# Patient Record
Sex: Male | Born: 1985 | Hispanic: No | Marital: Married | Smoking: Never smoker
Health system: Southern US, Community
[De-identification: ages and names within clinical notes are randomized; demographics above are authoritative.]

## PROBLEM LIST (undated history)

## (undated) DIAGNOSIS — E079 Disorder of thyroid, unspecified: Secondary | ICD-10-CM

## (undated) DIAGNOSIS — E7800 Pure hypercholesterolemia, unspecified: Secondary | ICD-10-CM

## (undated) DIAGNOSIS — R42 Dizziness and giddiness: Secondary | ICD-10-CM

## (undated) DIAGNOSIS — M93262 Osteochondritis dissecans, left knee: Secondary | ICD-10-CM

## (undated) DIAGNOSIS — Z973 Presence of spectacles and contact lenses: Secondary | ICD-10-CM

## (undated) HISTORY — PX: APPENDECTOMY: SHX54

---

## 2014-12-10 ENCOUNTER — Encounter (HOSPITAL_COMMUNITY): Payer: Self-pay | Admitting: Adult Health

## 2014-12-10 ENCOUNTER — Emergency Department (HOSPITAL_COMMUNITY)
Admission: EM | Admit: 2014-12-10 | Discharge: 2014-12-10 | Disposition: A | Discharge status: Home / Self Care | Payer: PPO | Attending: Emergency Medicine | Admitting: Emergency Medicine

## 2014-12-10 DIAGNOSIS — H81399 Other peripheral vertigo, unspecified ear: Secondary | ICD-10-CM | POA: Insufficient documentation

## 2014-12-10 DIAGNOSIS — R42 Dizziness and giddiness: Secondary | ICD-10-CM | POA: Diagnosis present

## 2014-12-10 DIAGNOSIS — H5500 Unspecified nystagmus: Secondary | ICD-10-CM | POA: Diagnosis not present

## 2014-12-10 LAB — I-STAT CHEM 8, ED
BUN: 13 mg/dL (ref 6–23)
CALCIUM ION: 1.21 mmol/L (ref 1.12–1.23)
CHLORIDE: 105 mmol/L (ref 96–112)
Creatinine, Ser: 0.8 mg/dL (ref 0.50–1.35)
GLUCOSE: 122 mg/dL — AB (ref 70–99)
HCT: 45 % (ref 39.0–52.0)
HEMOGLOBIN: 15.3 g/dL (ref 13.0–17.0)
Potassium: 3.7 mmol/L (ref 3.5–5.1)
Sodium: 142 mmol/L (ref 135–145)
TCO2: 20 mmol/L (ref 0–100)

## 2014-12-10 LAB — CBG MONITORING, ED: Glucose-Capillary: 112 mg/dL — ABNORMAL HIGH (ref 70–99)

## 2014-12-10 MED ORDER — MECLIZINE HCL 50 MG PO TABS: 50.0000 mg | ORAL_TABLET | Freq: Two times a day (BID) | ORAL | Status: DC | PRN

## 2014-12-10 MED ORDER — MECLIZINE HCL 25 MG PO TABS
25.0000 mg | ORAL_TABLET | Freq: Once | ORAL | Status: AC
  Administered 2014-12-10: 25 mg via ORAL
  Filled 2014-12-10: qty 1

## 2014-12-10 NOTE — ED Provider Notes (Signed)
CSN: [id number]     Arrival date & time 12/10/14  1904 History   First MD Initiated Contact with Patient 12/10/14 2056     Chief Complaint  Patient presents with  . Dizziness    (Consider location/radiation/quality/duration/timing/severity/associated sxs/prior Treatment) HPI Comments: Patient is a 29 year old male with no significant past medical history who presents to the emergency department for further evaluation of dizziness 3 days. Patient states that he has the sensation of being off balance. His symptoms are intermittent, worse when reading or upon waking in the morning. He states that his symptoms improve when he extends his neck and looks up towards. He denies associated fever, headache, nausea, vomiting, ear pain or discharge, and extremity numbness/weakness. No reported head injury or trauma. Patient has had similar symptoms one year ago for which she was given a medication that he cannot name. His symptoms lasted for approximately one week before resolving. He also had a recurrence of symptoms 1-2 months ago which improved after his bilateral ears were cleaned. He reports seeing an ENT for this.  Patient is a 29 y.o. male presenting with dizziness. The history is provided by the patient. No language interpreter was used.  Dizziness Quality:  Imbalance Severity:  Mild Onset quality:  Gradual Duration:  3 days Timing:  Intermittent Progression:  Waxing and waning Chronicity:  Recurrent Relieved by: Extending neck/looking up. Associated symptoms: no diarrhea, no headaches, no hearing loss, no nausea, no shortness of breath, no syncope, no vomiting and no weakness   Risk factors: hx of vertigo   Risk factors: no new medications     History reviewed. No pertinent past medical history. History reviewed. No pertinent past surgical history. History reviewed. No pertinent family history. History  Substance Use Topics  . Smoking status: Never Smoker   . Smokeless tobacco: Not on  file  . Alcohol Use: No    Review of Systems  Constitutional: Negative for fever.  HENT: Negative for hearing loss.   Respiratory: Negative for shortness of breath.   Cardiovascular: Negative for syncope.  Gastrointestinal: Negative for nausea, vomiting and diarrhea.  Neurological: Positive for dizziness. Negative for syncope, weakness, numbness and headaches.  All other systems reviewed and are negative.   Allergies  Review of patient's allergies indicates no known allergies.  Home Medications   Prior to Admission medications   Medication Sig Start Date End Date Taking? Authorizing Provider  meclizine (ANTIVERT) 50 MG tablet Take 1 tablet (50 mg total) by mouth 2 (two) times daily as needed for dizziness. 12/10/14   Antony MaduraKelly Tanganyika Bowlds, PA-C   BP 120/73 mmHg  Pulse 70  Temp(Src) 98 F (36.7 C) (Oral)  Resp 18  Ht 5\' 10"  (1.778 m)  Wt 190 lb (86.183 kg)  BMI 27.26 kg/m2  SpO2 100%   Physical Exam  Constitutional: He is oriented to person, place, and time. He appears well-developed and well-nourished. No distress.  Nontoxic/nonseptic appearing  HENT:  Head: Normocephalic and atraumatic.  Mouth/Throat: Oropharynx is clear and moist. No oropharyngeal exudate.  Uvula midline. Symmetric rise of the uvula with phonation.  Eyes: Conjunctivae are normal. Pupils are equal, round, and reactive to light. No scleral icterus. Right eye exhibits nystagmus. Left eye exhibits nystagmus.  PERRL. Horizontal nystagmus to the right in both eyes noted on testing of EOMs. Snellen 20/20 OU, 20/25 OS and OD  Neck: Normal range of motion.  No nuchal rigidity or meningsimus  Cardiovascular: Normal rate, regular rhythm and intact distal pulses.   Pulmonary/Chest: Effort  normal. No respiratory distress.  Respirations even and unlabored  Musculoskeletal: Normal range of motion.  Neurological: He is alert and oriented to person, place, and time. He exhibits normal muscle tone. Coordination normal.  GCS 15.  Speech is goal oriented. Patient moves extremities without ataxia. Sensation to light touch equal bilaterally. Patient has normal grip strength. Patellar and Achilles reflexes 2+ bilaterally. 5/5 strength against resistance in all major muscle groups. No ataxia or pronator drift.  Skin: Skin is warm and dry. No rash noted. He is not diaphoretic. No erythema. No pallor.  Psychiatric: He has a normal mood and affect. His behavior is normal.  Nursing note and vitals reviewed.   ED Course  Procedures (including critical care time) Labs Review Labs Reviewed  CBG MONITORING, ED - Abnormal; Notable for the following:    Glucose-Capillary 112 (*)    All other components within normal limits  I-STAT CHEM 8, ED - Abnormal; Notable for the following:    Glucose, Bld 122 (*)    All other components within normal limits    Imaging Review No results found.   EKG Interpretation None      MDM   Final diagnoses:  Vertigo, peripheral, unspecified laterality    29 year old male presents to the emergency department for further evaluation of dizziness characterized by a sense of imbalance. He reports a history of similar symptoms over the past year. Patient's neurologic exam is significant only for a right-sided horizontal nystagmus with EOMs testing. No exam findings to suggest central cause of patient's vertigo. Do not believe further emergent work up or imaging is indicated. Will manage as outpatient with Meclizine. Have advised PCP f/u and provided return precautions. Patient agreeable to plan with no unaddressed concerns. Patient discharged from the ED in good condition.   Filed Vitals:   12/10/14 2145 12/10/14 2200 12/10/14 2201 12/10/14 2222  BP: 126/78 131/92 131/92 120/73  Pulse: 77 66 94 70  Temp:      TempSrc:      Resp:   16 18  Height:      Weight:      SpO2: 97% 96% 98% 100%       Antony Madura, PA-C 12/10/14 2349  Dione Booze, MD 12/10/14 2352

## 2014-12-10 NOTE — ED Notes (Signed)
Presents with dizziness and feeling off balance that began three days ago. HE had a similair episode last year and went to a doctor and was given a medication for dizziness that helped him, he is unsure of the name of the medication. He also saw an ent and had his ears cleaned out and that helped. This episode is different, not as severe, but does not go away. Denies nausea, denies pain, denies headaches. Alert, oriented and MAEx4. Putting head up makes dizziness better. Nothing makes dizziness worse.

## 2014-12-10 NOTE — ED Notes (Signed)
Pt stable, ambulatory, denies any pain, dizziness improved, states understanding of discharge iinstructions

## 2014-12-10 NOTE — Discharge Instructions (Signed)
Recommend follow-up with a primary care provider. Refer to the resource guide below if you do not have a primary care doctor. Take Antivert as prescribed for persistent vertigo symptoms. You may also try using the half somersault maneuver (attached handout) for symptoms. Return to the ED as needed if symptoms worsen.  Benign Positional Vertigo Vertigo means you feel like you or your surroundings are moving when they are not. Benign positional vertigo is the most common form of vertigo. Benign means that the cause of your condition is not serious. Benign positional vertigo is more common in older adults. CAUSES  Benign positional vertigo is the result of an upset in the labyrinth system. This is an area in the middle ear that helps control your balance. This may be caused by a viral infection, head injury, or repetitive motion. However, often no specific cause is found. SYMPTOMS  Symptoms of benign positional vertigo occur when you move your head or eyes in different directions. Some of the symptoms may include:  Loss of balance and falls.  Vomiting.  Blurred vision.  Dizziness.  Nausea.  Involuntary eye movements (nystagmus). DIAGNOSIS  Benign positional vertigo is usually diagnosed by physical exam. If the specific cause of your benign positional vertigo is unknown, your caregiver may perform imaging tests, such as magnetic resonance imaging (MRI) or computed tomography (CT). TREATMENT  Your caregiver may recommend movements or procedures to correct the benign positional vertigo. Medicines such as meclizine, benzodiazepines, and medicines for nausea may be used to treat your symptoms. In rare cases, if your symptoms are caused by certain conditions that affect the inner ear, you may need surgery. HOME CARE INSTRUCTIONS   Follow your caregiver's instructions.  Move slowly. Do not make sudden body or head movements.  Avoid driving.  Avoid operating heavy machinery.  Avoid performing  any tasks that would be dangerous to you or others during a vertigo episode.  Drink enough fluids to keep your urine clear or pale yellow. SEEK IMMEDIATE MEDICAL CARE IF:   You develop problems with walking, weakness, numbness, or using your arms, hands, or legs.  You have difficulty speaking.  You develop severe headaches.  Your nausea or vomiting continues or gets worse.  You develop visual changes.  Your family or friends notice any behavioral changes.  Your condition gets worse.  You have a fever.  You develop a stiff neck or sensitivity to light. MAKE SURE YOU:   Understand these instructions.  Will watch your condition.  Will get help right away if you are not doing well or get worse. Document Released: 05/12/2006 Document Revised: 10/27/2011 Document Reviewed: 04/24/2011 [facility] Patient Information 2015 [employer], [place]. This information is not intended to replace advice given to you by your health care provider. Make sure you discuss any questions you have with your health care provider.   Emergency Department Resource Guide 1) Find a Doctor and Pay Out of Pocket Although you won't have to find out who is covered by your insurance plan, it is a good idea to ask around and get recommendations. You will then need to call the office and see if the doctor you have chosen will accept you as a new patient and what types of options they offer for patients who are self-pay. Some doctors offer discounts or will set up payment plans for their patients who do not have insurance, but you will need to ask so you aren't surprised when you get to your appointment.  2) Contact Your Local  Health Department Not all health departments have doctors that can see patients for sick visits, but many do, so it is worth a call to see if yours does. If you don't know where your local health department is, you can check in your phone book. The CDC also has a tool to help you locate your state's  health department, and many state websites also have listings of all of their local health departments.  3) Find a Walk-in Clinic If your illness is not likely to be very severe or complicated, you may want to try a walk in clinic. These are popping up all over the country in pharmacies, drugstores, and shopping centers. They're usually staffed by nurse practitioners or physician assistants that have been trained to treat common illnesses and complaints. They're usually fairly quick and inexpensive. However, if you have serious medical issues or chronic medical problems, these are probably not your best option.  No Primary Care Doctor: - Call Health Connect at  [phone] - they can help you locate a primary care doctor that  accepts your insurance, provides certain services, etc. - Physician Referral Service- [phone]  Chronic Pain Problems: Organization         Address  Phone   Notes  [facility]  [phone] Patients need to be referred by their primary care doctor.   Medication Assistance: Organization         Address  Phone   Notes  [facility] [address] [place], [place] [address] [phone] --Must be a resident of [facility] -- Must have NO insurance coverage whatsoever (no Medicaid/ Medicare, etc.) -- The pt. MUST have a primary care doctor that directs their care regularly and follows them in the community   [facility]ist  [phone]   [employer]  [phone]    Agencies that provide inexpensive medical care: Organization         Address  Phone   Notes  [facility]  [phone]   [facility]    [phone]   [facility] [address] [place], [place] [address] [phone]   [facility] [address] [place] [place] [phone]   Planned Parenthood    [phone]   [facility]    [phone]    [facility]  [address], [place] Phone:  [phone], Fax:  [phone] Hours of Operation:  9 am - 6 pm, M-F.  Also accepts Medicaid/Medicare and self-pay.  [facility]  [address], [place] Phone: [phone], Fax: [phone]. Hours of Operation:  8:30 am - 5:30 pm, M-F.  Also accepts Medicaid and self-pay.  [facility] [place] [address] [place] Phone: [phone]   [facility] [address] Natasha Bence [place], [place] [phone], Ext. [phone] Mondays & Thursdays: 7-9 AM.  First 15 patients are seen on a first come, first serve basis.    Medicaid-accepting [facility] Providers:  Organization         Address  Phone   Notes  [facility] [address],  [phone] Also accepts self-pay patients.  [facility] [address] Laurell Josephs [place], [place]  [phone]   [facility] [address], [place] [phone]  [facility] [address] [place] [address], [place] [phone]) [phone]   Renaye RakersVeita Bland [address], [place]   [phone]) [phone] Only accepts [place] Access [place] patients after they have their name applied to their card.   Self-Pay (no insurance) in [facility]:  Organization         Address  Phone   Notes  Sickle Cell Patients, [facility] [address], [place] [phone]) [phone]   [facility] [address], [place] [phone]) [phone]   [facility] [place]  [address],  [phone]) [phone]   [facility]/Dr. Osei-Bonsu  [address] [place] [address], [place] or [phone], [place] [phone]) [phone] Phone number for both [place] and [place] locations is the same.  [facility] [address], [place] [phone](336) [phone]     [facility] [place] [address] [place] [address], [place] or [address] [phone]) [phone]   [facility] [address], [place] [phone](336) [phone], phone; [phone](336) [phone], fax Sees patients 1st and 3rd Saturday of every month.  Must not qualify for public or private insurance (i.e. Medicaid, Medicare, [place] Health Choice, Veterans' Benefits)  Household income should be no more than 200% of the poverty level The clinic cannot treat you if you are pregnant or think you are pregnant  Sexually transmitted diseases are not treated at the clinic.    Dental Care: Organization         Address  Phone  Notes  [facility] [address], [place] [phone] Accepts children up to age 30 who are enrolled in [place] or [place] Health Choice; pregnant women with a Medicaid card; and children who have applied for Medicaid or [place] Health Choice, but were declined, whose parents can pay a reduced fee at time of service.  [facility] [place]  [address], [place] [phone]) [phone] Accepts children up to age 30 who are enrolled in [place] or [place] Health Choice; pregnant women with a Medicaid card; and children who have applied for Medicaid or [place] Health Choice, but were declined, whose parents can pay a reduced fee at time of service.  [facility]  [address], [place] [phone]) [phone] Patients are seen by appointment only. Walk-ins are not accepted. [facility] will see patients 30 years of age and older. Monday - Tuesday (8am-5pm) Most Wednesdays (8:30-5pm) $30 per visit, cash only  [facility]  [address], [facility] [place] [phone] Patients are seen by appointment only. Walk-ins are not accepted. [facility] will see patients 30 years of age and older. One Wednesday Evening (Monthly: Volunteer Based).   $30 per visit, cash only  [employer] [facility]  [phone]) [phone] for adults; Children under age 794, call Graduate Pediatric Dentistry at [phone]. Children aged 54-14, please call [phone]) [phone] to request a pediatric application.  Dental services are provided in all areas of dental care including fillings, crowns and bridges, complete and partial dentures, implants, gum treatment, root canals, and extractions. Preventive care is also provided. Treatment is provided to both adults and children. Patients are selected via a lottery and there is often a waiting list.   [facility] [address], [place]  [phone]) [phone] [email or web address]  [facility] [address] [place], [place] [phone], Ext. [phone] Second and Fourth Thursday of each month, opens at 6:30 AM; Clinic ends at 9 AM.  Patients are seen on a first-come first-served basis, and a limited number are seen during each clinic.   [facility]  [address] Ether Griffins [place], [place] [phone]   Eligibility Requirements You must have lived in [place], [place], or [place] counties for at least the last three months.   You cannot be eligible for state or federal sponsored [place], including [employer], [place], or [employer].   You generally cannot be eligible for healthcare insurance through your employer.    How to apply: Eligibility screenings are held every Tuesday and Wednesday afternoon from 1:00 pm until 4:00 pm. You do not need an appointment for the interview!  [facility] [address] [place], [place] [phone]   [facility] Health Department  [phone]   [facility] Health Department  [phone]   [facility] Health Department  [phone]    Behavioral Health Resources in the Community: Intensive Outpatient Programs Organization         Address  Phone  Notes  [facility] Services [address] [place], [place] [phone]   [facility] [address], [place], [place] [phone]   [facility] [address] [place], [place]  [phone]   [facility] Health [address],  [place], [place] [phone] or [phone]   Substance Abuse Resources Organization         Address  Phone  Notes  [facility]  [phone]   [facility]  [phone]   [facility] [place]  [phone]   Floydene Flock  [phone]   Residential & Outpatient Substance Abuse Program  [phone]   Psychological Services Organization         Address  Phone  Notes  [facility]  [phone]   [facility]  [phone]   [facility] Mental Health [address], [place] [phone] or [phone]    Mobile Crisis Teams Organization         Address  Phone  Notes  [facility], Mobile Crisis Care Unit  [phone]   [facility]  [address] [place], [place] [phone]   Doristine Locks [address] [place] [phone]    Self-Help/Support Groups Organization         Address  Phone             Notes  Mental Health Assoc. of  - variety of support groups  [phone]- I7437963 Call for more information  Narcotics Anonymous (NA), [facility] [address], [employer] [place]  2 meetings at this location   Statistician         Address  Phone  Notes  [facility] [address],    [place]  [phone]   [facility]  [address], [place] [address], [place], [place] [phone]   [facility] [address] [place], [place] [phone] Admissions: 8am-3pm M-F  [facility] [address]    [place], [place] [phone]   [facility] [address] Starling Manns [place], [place] [phone]   [facility] [address],  [place], [place] [phone]   [facility] - Intensive Outpatient [phone], [place], [place]   [facility] ([place]  [place], [place] [phone] or [phone]   [facility] ([facility]) [address] [place], [place] Accepts Medicaid  [facility] [address],  [place] [phone] Substance Abuse/Addiction Treatment   [facility] Organization         Address  Phone  Notes  [facility]  [phone]   Domenic Schwab, PhD [address] Arlis [place], [place]   [phone] or [phone]   [place], [place] [phone]   [address], [place], [place] [phone] Insurance/Medicaid/sponsorship through [facility] and Families [address] [place]                                    [place], [place] [phone] [place] [address], [place] [phone]    Dr. Adele Schilder  [phone]   [facility] Dept. 1) [address] [place] 2) Pablo Pena 3)  [address], [place] [phone]  [phone]   [place] [phone] or [phone] (After Hours)

## 2015-02-21 ENCOUNTER — Other Ambulatory Visit: Payer: Self-pay | Admitting: Orthopedic Surgery

## 2015-03-02 ENCOUNTER — Encounter (HOSPITAL_BASED_OUTPATIENT_CLINIC_OR_DEPARTMENT_OTHER): Payer: Self-pay | Admitting: *Deleted

## 2015-03-02 NOTE — Progress Notes (Signed)
NPO AFTER MN WITH EXCEPTION CLEAR LIQUIDS UNTIL 0900.  ARRIVE AT 1315.  NEEDS HG.  PT STATED SPEAKS ENGLISH OKAY BUT SOME THINGS MEDICAL UNSURE, REQUESTED FOR ARABIC INTERPRETOR.  SEND EMAIL REQUESTING FOR INTERPRETOR.  WILL PUT CONFIRMATION IN CHART WHEN I GET IT.

## 2015-03-06 ENCOUNTER — Ambulatory Visit (HOSPITAL_BASED_OUTPATIENT_CLINIC_OR_DEPARTMENT_OTHER): Payer: PPO | Admitting: Anesthesiology

## 2015-03-06 ENCOUNTER — Encounter (HOSPITAL_BASED_OUTPATIENT_CLINIC_OR_DEPARTMENT_OTHER)
Admission: RE | Disposition: A | Discharge status: Home / Self Care | Payer: Self-pay | Source: Ambulatory Visit | Attending: Specialist

## 2015-03-06 ENCOUNTER — Ambulatory Visit (HOSPITAL_BASED_OUTPATIENT_CLINIC_OR_DEPARTMENT_OTHER)
Admission: RE | Admit: 2015-03-06 | Discharge: 2015-03-06 | Disposition: A | Discharge status: Home / Self Care | Payer: PPO | Source: Ambulatory Visit | Attending: Specialist | Admitting: Specialist

## 2015-03-06 ENCOUNTER — Encounter (HOSPITAL_BASED_OUTPATIENT_CLINIC_OR_DEPARTMENT_OTHER): Payer: Self-pay | Admitting: *Deleted

## 2015-03-06 DIAGNOSIS — Y999 Unspecified external cause status: Secondary | ICD-10-CM | POA: Diagnosis not present

## 2015-03-06 DIAGNOSIS — Y939 Activity, unspecified: Secondary | ICD-10-CM | POA: Insufficient documentation

## 2015-03-06 DIAGNOSIS — S83282A Other tear of lateral meniscus, current injury, left knee, initial encounter: Secondary | ICD-10-CM | POA: Diagnosis not present

## 2015-03-06 DIAGNOSIS — Y929 Unspecified place or not applicable: Secondary | ICD-10-CM | POA: Insufficient documentation

## 2015-03-06 DIAGNOSIS — M93262 Osteochondritis dissecans, left knee: Secondary | ICD-10-CM | POA: Insufficient documentation

## 2015-03-06 DIAGNOSIS — Z9889 Other specified postprocedural states: Secondary | ICD-10-CM

## 2015-03-06 HISTORY — PX: KNEE ARTHROSCOPY: SHX127

## 2015-03-06 HISTORY — DX: Presence of spectacles and contact lenses: Z97.3

## 2015-03-06 HISTORY — PX: CHONDROPLASTY: SHX5177

## 2015-03-06 HISTORY — DX: Osteochondritis dissecans, left knee: M93.262

## 2015-03-06 LAB — POCT HEMOGLOBIN-HEMACUE: Hemoglobin: 15.5 g/dL (ref 13.0–17.0)

## 2015-03-06 SURGERY — ARTHROSCOPY, KNEE
Anesthesia: General | Site: Knee | Laterality: Left

## 2015-03-06 MED ORDER — CEFAZOLIN SODIUM-DEXTROSE 2-3 GM-% IV SOLR
INTRAVENOUS | Status: AC
  Filled 2015-03-06: qty 50

## 2015-03-06 MED ORDER — LIDOCAINE HCL (CARDIAC) 20 MG/ML IV SOLN
INTRAVENOUS | Status: DC | PRN
  Administered 2015-03-06: 100 mg via INTRAVENOUS

## 2015-03-06 MED ORDER — HYDROMORPHONE HCL 1 MG/ML IJ SOLN
0.2500 mg | INTRAMUSCULAR | Status: DC | PRN
  Filled 2015-03-06: qty 1

## 2015-03-06 MED ORDER — DEXAMETHASONE SODIUM PHOSPHATE 4 MG/ML IJ SOLN
INTRAMUSCULAR | Status: DC | PRN
  Administered 2015-03-06: 10 mg via INTRAVENOUS

## 2015-03-06 MED ORDER — LACTATED RINGERS IV SOLN
INTRAVENOUS | Status: DC
Start: 2015-03-06 — End: 2015-03-06
  Administered 2015-03-06: 14:00:00 via INTRAVENOUS
  Filled 2015-03-06: qty 1000

## 2015-03-06 MED ORDER — MIDAZOLAM HCL 5 MG/5ML IJ SOLN
INTRAMUSCULAR | Status: DC | PRN
  Administered 2015-03-06: 2 mg via INTRAVENOUS

## 2015-03-06 MED ORDER — CEPHALEXIN 500 MG PO CAPS: 500.0000 mg | ORAL_CAPSULE | Freq: Three times a day (TID) | ORAL | Status: DC

## 2015-03-06 MED ORDER — MIDAZOLAM HCL 2 MG/2ML IJ SOLN
INTRAMUSCULAR | Status: AC
Start: 2015-03-06 — End: 2015-03-06
  Filled 2015-03-06: qty 2

## 2015-03-06 MED ORDER — POVIDONE-IODINE 7.5 % EX SOLN
Freq: Once | CUTANEOUS | Status: DC
  Filled 2015-03-06: qty 118

## 2015-03-06 MED ORDER — PROPOFOL 10 MG/ML IV BOLUS
INTRAVENOUS | Status: DC | PRN
Start: 2015-03-06 — End: 2015-03-06
  Administered 2015-03-06: 200 mg via INTRAVENOUS

## 2015-03-06 MED ORDER — HYDROCODONE-ACETAMINOPHEN 5-325 MG PO TABS
ORAL_TABLET | ORAL | Status: AC
  Filled 2015-03-06: qty 1

## 2015-03-06 MED ORDER — MORPHINE SULFATE 4 MG/ML IJ SOLN
INTRAMUSCULAR | Status: DC | PRN
  Administered 2015-03-06: 4 mg via INTRAVENOUS

## 2015-03-06 MED ORDER — FENTANYL CITRATE (PF) 100 MCG/2ML IJ SOLN
INTRAMUSCULAR | Status: AC
  Filled 2015-03-06: qty 6

## 2015-03-06 MED ORDER — BUPIVACAINE HCL 0.25 % IJ SOLN
INTRAMUSCULAR | Status: DC | PRN
  Administered 2015-03-06: 30 mL

## 2015-03-06 MED ORDER — HYDROCODONE-ACETAMINOPHEN 5-325 MG PO TABS
1.0000 | ORAL_TABLET | ORAL | Status: DC | PRN
Start: 2015-03-06 — End: 2015-09-11

## 2015-03-06 MED ORDER — ONDANSETRON HCL 4 MG PO TABS: 4.0000 mg | ORAL_TABLET | Freq: Three times a day (TID) | ORAL | Status: DC | PRN

## 2015-03-06 MED ORDER — HYDROCODONE-ACETAMINOPHEN 5-325 MG PO TABS
1.0000 | ORAL_TABLET | ORAL | Status: DC | PRN
  Administered 2015-03-06: 1 via ORAL
  Filled 2015-03-06: qty 2

## 2015-03-06 MED ORDER — FENTANYL CITRATE (PF) 100 MCG/2ML IJ SOLN
INTRAMUSCULAR | Status: DC | PRN
  Administered 2015-03-06 (×5): 25 ug via INTRAVENOUS
  Administered 2015-03-06: 50 ug via INTRAVENOUS

## 2015-03-06 MED ORDER — SODIUM CHLORIDE 0.9 % IR SOLN
Status: DC | PRN
  Administered 2015-03-06: 6000 mL

## 2015-03-06 MED ORDER — ASPIRIN EC 325 MG PO TBEC: 325.0000 mg | DELAYED_RELEASE_TABLET | Freq: Two times a day (BID) | ORAL | Status: DC

## 2015-03-06 MED ORDER — ONDANSETRON HCL 4 MG/2ML IJ SOLN
INTRAMUSCULAR | Status: DC | PRN
  Administered 2015-03-06: 4 mg via INTRAVENOUS

## 2015-03-06 MED ORDER — CEFAZOLIN SODIUM-DEXTROSE 2-3 GM-% IV SOLR
2.0000 g | INTRAVENOUS | Status: AC
Start: 2015-03-07 — End: 2015-03-06
  Administered 2015-03-06: 2 g via INTRAVENOUS
  Filled 2015-03-06: qty 50

## 2015-03-06 MED ORDER — MORPHINE SULFATE 4 MG/ML IJ SOLN
INTRAMUSCULAR | Status: AC
  Filled 2015-03-06: qty 1

## 2015-03-06 SURGICAL SUPPLY — 60 items
BANDAGE ESMARK 6X9 LF (GAUZE/BANDAGES/DRESSINGS) ×1 IMPLANT
BLADE 4.2CUDA (BLADE) IMPLANT
BLADE CUDA 4.2 (BLADE) IMPLANT
BLADE CUDA GRT WHITE 3.5 (BLADE) ×2 IMPLANT
BLADE CUDA SHAVER 3.5 (BLADE) IMPLANT
BNDG ESMARK 6X9 LF (GAUZE/BANDAGES/DRESSINGS) ×2
BNDG GAUZE ELAST 4 BULKY (GAUZE/BANDAGES/DRESSINGS) ×2 IMPLANT
CANISTER SUCT LVC 12 LTR MEDI- (MISCELLANEOUS) IMPLANT
CANISTER SUCTION 1200CC (MISCELLANEOUS) ×2 IMPLANT
CLOTH BEACON ORANGE TIMEOUT ST (SAFETY) ×2 IMPLANT
CUFF TOURNIQUET SINGLE 34IN LL (TOURNIQUET CUFF) ×2 IMPLANT
DRAPE ARTHROSCOPY W/POUCH 114 (DRAPES) ×2 IMPLANT
DRAPE INCISE 23X17 IOBAN STRL (DRAPES) ×1
DRAPE INCISE IOBAN 23X17 STRL (DRAPES) ×1 IMPLANT
DRAPE INCISE IOBAN 66X45 STRL (DRAPES) ×2 IMPLANT
DRSG PAD ABDOMINAL 8X10 ST (GAUZE/BANDAGES/DRESSINGS) ×2 IMPLANT
DURAPREP 26ML APPLICATOR (WOUND CARE) ×2 IMPLANT
ELECT MENISCUS 165MM 90D (ELECTRODE) IMPLANT
ELECT REM PT RETURN 9FT ADLT (ELECTROSURGICAL)
ELECTRODE REM PT RTRN 9FT ADLT (ELECTROSURGICAL) IMPLANT
GAUZE XEROFORM 1X8 LF (GAUZE/BANDAGES/DRESSINGS) ×2 IMPLANT
GLOVE BIO SURGEON STRL SZ7.5 (GLOVE) ×2 IMPLANT
GLOVE BIO SURGEON STRL SZ8 (GLOVE) ×4 IMPLANT
GLOVE BIOGEL M 6.5 STRL (GLOVE) ×2 IMPLANT
GLOVE BIOGEL PI IND STRL 6.5 (GLOVE) ×1 IMPLANT
GLOVE BIOGEL PI IND STRL 7.5 (GLOVE) ×2 IMPLANT
GLOVE BIOGEL PI INDICATOR 6.5 (GLOVE) ×1
GLOVE BIOGEL PI INDICATOR 7.5 (GLOVE) ×2
GLOVE INDICATOR 8.0 STRL GRN (GLOVE) ×4 IMPLANT
GOWN STRL REUS W/ TWL LRG LVL3 (GOWN DISPOSABLE) ×1 IMPLANT
GOWN STRL REUS W/ TWL XL LVL3 (GOWN DISPOSABLE) ×1 IMPLANT
GOWN STRL REUS W/TWL LRG LVL3 (GOWN DISPOSABLE) ×1
GOWN STRL REUS W/TWL XL LVL3 (GOWN DISPOSABLE) ×7 IMPLANT
IMMOBILIZER KNEE 22 UNIV (SOFTGOODS) IMPLANT
IMMOBILIZER KNEE 24 THIGH 36 (MISCELLANEOUS) IMPLANT
IMMOBILIZER KNEE 24 UNIV (MISCELLANEOUS)
IV NS IRRIG 3000ML ARTHROMATIC (IV SOLUTION) ×4 IMPLANT
KIT CARTILAGE BIOPSY (MISCELLANEOUS) ×2 IMPLANT
KNEE WRAP E Z 3 GEL PACK (MISCELLANEOUS) ×2 IMPLANT
MANIFOLD NEPTUNE II (INSTRUMENTS) ×2 IMPLANT
MINI VAC (SURGICAL WAND) ×2 IMPLANT
NEEDLE HYPO 22GX1.5 SAFETY (NEEDLE) ×2 IMPLANT
PACK ARTHROSCOPY DSU (CUSTOM PROCEDURE TRAY) ×2 IMPLANT
PACK BASIN DAY SURGERY FS (CUSTOM PROCEDURE TRAY) ×2 IMPLANT
PAD ABD 8X10 STRL (GAUZE/BANDAGES/DRESSINGS) ×4 IMPLANT
PAD ARMBOARD 7.5X6 YLW CONV (MISCELLANEOUS) IMPLANT
PADDING CAST ABS 4INX4YD NS (CAST SUPPLIES) ×1
PADDING CAST ABS COTTON 4X4 ST (CAST SUPPLIES) ×1 IMPLANT
PENCIL BUTTON HOLSTER BLD 10FT (ELECTRODE) IMPLANT
SET ARTHROSCOPY TUBING (MISCELLANEOUS) ×1
SET ARTHROSCOPY TUBING LN (MISCELLANEOUS) ×1 IMPLANT
SPONGE GAUZE 4X4 12PLY (GAUZE/BANDAGES/DRESSINGS) ×2 IMPLANT
SPONGE GAUZE 4X4 12PLY STER LF (GAUZE/BANDAGES/DRESSINGS) ×2 IMPLANT
SUT ETHILON 4 0 PS 2 18 (SUTURE) ×2 IMPLANT
SYR CONTROL 10ML LL (SYRINGE) ×2 IMPLANT
TOWEL OR 17X24 6PK STRL BLUE (TOWEL DISPOSABLE) ×2 IMPLANT
TUBE CONNECTING 12X1/4 (SUCTIONS) ×2 IMPLANT
WAND 30 DEG SABER W/CORD (SURGICAL WAND) IMPLANT
WAND 90 DEG TURBOVAC W/CORD (SURGICAL WAND) IMPLANT
WATER STERILE IRR 500ML POUR (IV SOLUTION) ×2 IMPLANT

## 2015-03-06 NOTE — Op Note (Signed)
[id number]#[id number]

## 2015-03-06 NOTE — Interval H&P Note (Signed)
History and Physical Interval Note:  03/06/2015 1:56 PM  Timothy Austin  has presented today for surgery, with the diagnosis of LEFT KNEE OSTEOCHRONDRITIS DESSICANS  The various methods of treatment have been discussed with the patient and family. After consideration of risks, benefits and other options for treatment, the patient has consented to  Procedure(s) with comments: ARTHROSCOPY KNEE DEBRIDEMENT  OF OSTEOCHRONDRITIS DESSICANS  AND HARVEST CARTILEDGE (Left) - LOCAL WITH MAC as a surgical intervention .  The patient's history has been reviewed, patient examined, no change in status, stable for surgery.  I have reviewed the patient's chart and labs.  Questions were answered to the patient's satisfaction.     Sharmila Wrobleski ANDREW

## 2015-03-06 NOTE — Discharge Instructions (Signed)

## 2015-03-06 NOTE — H&P (View-Only) (Signed)
NPO AFTER MN WITH EXCEPTION CLEAR LIQUIDS UNTIL 0900.  ARRIVE AT 1315.  NEEDS HG.  PT STATED SPEAKS ENGLISH OKAY BUT SOME THINGS MEDICAL UNSURE, REQUESTED FOR ARABIC INTERPRETOR.  SEND EMAIL REQUESTING FOR INTERPRETOR.  WILL PUT CONFIRMATION IN CHART WHEN I GET IT. 

## 2015-03-06 NOTE — Anesthesia Preprocedure Evaluation (Addendum)
Anesthesia Evaluation  Patient identified by MRN, date of birth, ID band Patient awake    Reviewed: Allergy & Precautions, NPO status , Patient's Chart, lab work & pertinent test results  Airway Mallampati: I  TM Distance: >3 FB Neck ROM: Full    Dental   Pulmonary neg pulmonary ROS,  breath sounds clear to auscultation        Cardiovascular negative cardio ROS  Rhythm:Regular Rate:Normal     Neuro/Psych    GI/Hepatic negative GI ROS, Neg liver ROS,   Endo/Other  negative endocrine ROS  Renal/GU      Musculoskeletal   Abdominal   Peds  Hematology   Anesthesia Other Findings   Reproductive/Obstetrics                            Anesthesia Physical Anesthesia Plan  ASA: I  Anesthesia Plan: General   Post-op Pain Management:    Induction: Intravenous  Airway Management Planned: LMA  Additional Equipment:   Intra-op Plan:   Post-operative Plan: Extubation in OR  Informed Consent: I have reviewed the patients History and Physical, chart, labs and discussed the procedure including the risks, benefits and alternatives for the proposed anesthesia with the patient or authorized representative who has indicated his/her understanding and acceptance.   Dental advisory given  Plan Discussed with: CRNA and Anesthesiologist  Anesthesia Plan Comments:         Anesthesia Quick Evaluation

## 2015-03-06 NOTE — Anesthesia Postprocedure Evaluation (Signed)
  Anesthesia Post-op Note  Patient: Timothy Austin  Procedure(s) Performed: Procedure(s): ARTHROSCOPY KNEE DEBRIDEMENT  of OCD LESION  AND HARVEST CARTILAGE (Left) CHONDROPLASTY (Left)  Patient Location: PACU  Anesthesia Type:General  Level of Consciousness: awake  Airway and Oxygen Therapy: Patient Spontanous Breathing  Post-op Pain: mild  Post-op Assessment: Post-op Vital signs reviewed LLE Motor Response: Purposeful movement, Responds to commands LLE Sensation: Decreased (in knee)          Post-op Vital Signs: Reviewed  Last Vitals:  Filed Vitals:   03/06/15 1715  BP: 120/76  Pulse: 80  Temp:   Resp: 13    Complications: No apparent anesthesia complications
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
## 2015-03-06 NOTE — Anesthesia Procedure Notes (Signed)
Procedure Name: LMA Insertion Date/Time: 03/06/2015 3:08 PM Performed by: Renella CunasHAZEL, Daja Shuping D Pre-anesthesia Checklist: Patient identified, Emergency Drugs available, Suction available and Patient being monitored Patient Re-evaluated:Patient Re-evaluated prior to inductionOxygen Delivery Method: Circle System Utilized Preoxygenation: Pre-oxygenation with 100% oxygen Intubation Type: IV induction Ventilation: Mask ventilation without difficulty LMA: LMA inserted LMA Size: 4.0 Number of attempts: 1 Airway Equipment and Method: Bite block Placement Confirmation: positive ETCO2 Tube secured with: Tape Dental Injury: Teeth and Oropharynx as per pre-operative assessment

## 2015-03-06 NOTE — Transfer of Care (Signed)
Immediate Anesthesia Transfer of Care Note  Patient: Timothy Austin  Procedure(s) Performed: Procedure(s) (LRB): ARTHROSCOPY KNEE DEBRIDEMENT  of OCD LESION  AND HARVEST CARTILAGE (Left) CHONDROPLASTY (Left)  Patient Location: PACU  Anesthesia Type: General  Level of Consciousness: awake, oriented, sedated and patient cooperative  Airway & Oxygen Therapy: Patient Spontanous Breathing and Patient connected to face mask oxygen  Post-op Assessment: Report given to PACU RN and Post -op Vital signs reviewed and stable  Post vital signs: Reviewed and stable  Complications: No apparent anesthesia complications

## 2015-03-07 ENCOUNTER — Encounter (HOSPITAL_BASED_OUTPATIENT_CLINIC_OR_DEPARTMENT_OTHER): Payer: Self-pay | Admitting: Specialist

## 2015-03-07 NOTE — Op Note (Signed)
NAME:  Timothy BlizzardLSHAHRANI, Arlynn             ACCOUNT NO.:  [id number]  MEDICAL RECORD NO.:  [id number]  LOCATION:                               [facility]  PHYSICIAN:  Erasmo Leventhalobert Andrew Bill Mcvey, M.D.DATE OF BIRTH:  19-Oct-1985  DATE OF PROCEDURE:  03/06/2015 DATE OF DISCHARGE:  03/06/2015                              OPERATIVE REPORT   PREOPERATIVE DIAGNOSIS:  Left knee osteochondritis dissecans, medial femoral condyle, unstable.  POSTOPERATIVE DIAGNOSES: 1. Left knee osteochondritis dissecans, medial femoral condyle,     unstable and fragmented, 2 cm x 2 cm. 2. Horizontal cleavage tear of lateral meniscus.  PROCEDURES: 1. Left knee arthroscopic partial lateral meniscectomy. 2. Removal of osteochondral defect and loose body, medial femoral     condyle. 3. Harvest of articular cartilage for possible later Carticel     procedure.  SURGEON:  Erasmo Leventhalobert Andrew Swan [place], M.D.  ASSISTANT:  Marciano SequinBryson Stillwell, PA-C.  ANESTHESIA:  General with intraoperative knee block.  ESTIMATED BLOOD LOSS:  Minimal.  DRAINS:  None.  COMPLICATIONS:  None.  TOURNIQUET TIME:  26 minutes at 300 mmHg.  SPECIMENS:  Articular cartilage specimen was sent to [facility] [place].  OPERATIVE DETAILS:  The patient was counseled in the holding area. Correct site was marked and signed appropriately.  IV was started. Antibiotics were given, brought to the operating room, placed in supine position under general anesthesia.  All extremities were well padded and bumped.  Left thigh was placed on the thigh holder, prepped with DuraPrep and draped into a sterile fashion.  Time-out was done, confirmed the left side.  Exsanguinated with an Esmarch.  Tourniquet was inflated to 300 mmHg.  Arthroscopic portal was established, proximal medial, inferomedial and inferolateral.  Diagnostic arthroscopy revealed suprapatellar pouch, medial and lateral gutters, patellofemoral joint to be unremarkable.  ACL and PCL were intact.  Lateral  side was inspected. Articular cartilage was healthy, but there was a horizontal cleavage- type tear of the lateral meniscus, utilizing basket, motorized shaver and a Mini-Vac system.  A partial lateral meniscectomy was performed back to the nice stable edge.  Medial side was inspected.  There was an osteochondral fragment in an intercondylar notch, it was removed.  We initially harvested the articular cartilage from the lateral aspect of the intercondylar notch in typical [employer] fashion.  This was then sent to for tissue culture.  I changed back to the defect, it was probed with a nerve hook, found to be completely unstable.  It was released from this defect and then removed in its entirety.  Defect was then shaved circumferentially, making sure that entire loose cartilage was removed and it was measured and found to be approximately 2 cm medial to lateral and 2 cm anterior to posterior.  Irrigated and arthroscopic equipment was removed. Portals were closed with 4-0 nylon suture, 10 mL of Sensorcaine was placed into the skin and 10 mL of 0.25% Sensorcaine and 4 mg morphine sulfate was injected in the knee joint.  Satisfied with TED hose, ice pack.  No complications.  He was awakened and taken from the operating room to PACU in stable condition.  Also after deflation of the tourniquet, pulses were intact.  ______________________________ Erasmo Leventhal, M.D.     RAC/MEDQ  D:  03/06/2015  T:  03/06/2015  Job:  [id number]

## 2015-04-26 ENCOUNTER — Emergency Department (HOSPITAL_COMMUNITY)
Admission: EM | Admit: 2015-04-26 | Discharge: 2015-04-26 | Disposition: A | Discharge status: Home / Self Care | Payer: PPO | Attending: Emergency Medicine | Admitting: Emergency Medicine

## 2015-04-26 ENCOUNTER — Emergency Department (HOSPITAL_COMMUNITY): Payer: PPO

## 2015-04-26 ENCOUNTER — Encounter (HOSPITAL_COMMUNITY): Payer: Self-pay | Admitting: Emergency Medicine

## 2015-04-26 DIAGNOSIS — R1030 Lower abdominal pain, unspecified: Secondary | ICD-10-CM | POA: Diagnosis present

## 2015-04-26 DIAGNOSIS — Z8739 Personal history of other diseases of the musculoskeletal system and connective tissue: Secondary | ICD-10-CM | POA: Diagnosis not present

## 2015-04-26 DIAGNOSIS — N200 Calculus of kidney: Secondary | ICD-10-CM | POA: Insufficient documentation

## 2015-04-26 LAB — CBC WITH DIFFERENTIAL/PLATELET
Basophils Absolute: 0 10*3/uL (ref 0.0–0.1)
Basophils Relative: 1 % (ref 0–1)
Eosinophils Absolute: 0.1 10*3/uL (ref 0.0–0.7)
Eosinophils Relative: 2 % (ref 0–5)
HCT: 39.6 % (ref 39.0–52.0)
Hemoglobin: 13.4 g/dL (ref 13.0–17.0)
Lymphocytes Relative: 27 % (ref 12–46)
Lymphs Abs: 1.4 10*3/uL (ref 0.7–4.0)
MCH: 29.8 pg (ref 26.0–34.0)
MCHC: 33.8 g/dL (ref 30.0–36.0)
MCV: 88 fL (ref 78.0–100.0)
Monocytes Absolute: 0.4 10*3/uL (ref 0.1–1.0)
Monocytes Relative: 7 % (ref 3–12)
Neutro Abs: 3.3 10*3/uL (ref 1.7–7.7)
Neutrophils Relative %: 63 % (ref 43–77)
Platelets: 184 10*3/uL (ref 150–400)
RBC: 4.5 MIL/uL (ref 4.22–5.81)
RDW: 12.8 % (ref 11.5–15.5)
WBC: 5.2 10*3/uL (ref 4.0–10.5)

## 2015-04-26 LAB — URINE MICROSCOPIC-ADD ON

## 2015-04-26 LAB — URINALYSIS, ROUTINE W REFLEX MICROSCOPIC
Bilirubin Urine: NEGATIVE
Glucose, UA: NEGATIVE mg/dL
Hgb urine dipstick: NEGATIVE
Ketones, ur: NEGATIVE mg/dL
Leukocytes, UA: NEGATIVE
Nitrite: NEGATIVE
Protein, ur: 30 mg/dL — AB
Specific Gravity, Urine: 1.03 (ref 1.005–1.030)
Urobilinogen, UA: 0.2 mg/dL (ref 0.0–1.0)
pH: 6 (ref 5.0–8.0)

## 2015-04-26 LAB — BASIC METABOLIC PANEL
Anion gap: 8 (ref 5–15)
BUN: 13 mg/dL (ref 6–20)
CO2: 24 mmol/L (ref 22–32)
Calcium: 8.9 mg/dL (ref 8.9–10.3)
Chloride: 104 mmol/L (ref 101–111)
Creatinine, Ser: 1.01 mg/dL (ref 0.61–1.24)
GFR calc Af Amer: >60 mL/min (ref >60)
GFR calc non Af Amer: >60 mL/min (ref >60)
Glucose, Bld: 111 mg/dL — ABNORMAL HIGH (ref 65–99)
Potassium: 3.4 mmol/L — ABNORMAL LOW (ref 3.5–5.1)
Sodium: 136 mmol/L (ref 135–145)

## 2015-04-26 MED ORDER — ONDANSETRON HCL 4 MG/2ML IJ SOLN
4.0000 mg | Freq: Once | INTRAMUSCULAR | Status: AC
  Administered 2015-04-26: 4 mg via INTRAVENOUS
  Filled 2015-04-26: qty 2

## 2015-04-26 MED ORDER — OXYCODONE-ACETAMINOPHEN 5-325 MG PO TABS: 1.0000 | ORAL_TABLET | ORAL | Status: DC | PRN

## 2015-04-26 MED ORDER — HYDROMORPHONE HCL 1 MG/ML IJ SOLN
1.0000 mg | Freq: Once | INTRAMUSCULAR | Status: AC
  Administered 2015-04-26: 1 mg via INTRAVENOUS
  Filled 2015-04-26: qty 1

## 2015-04-26 MED ORDER — ONDANSETRON HCL 4 MG PO TABS: 4.0000 mg | ORAL_TABLET | Freq: Four times a day (QID) | ORAL | Status: DC

## 2015-04-26 MED ORDER — SODIUM CHLORIDE 0.9 % IV BOLUS (SEPSIS)
1000.0000 mL | Freq: Once | INTRAVENOUS | Status: AC
  Administered 2015-04-26: 1000 mL via INTRAVENOUS

## 2015-04-26 NOTE — ED Notes (Addendum)
Pt received  toradol, 50 mcg Fentanyl, and 4 mg Zofran IV by EMS prior to arrival in ED. Pt states pain has decreased from 10 to 3/10 and pt to restroom to provide urine sample.

## 2015-04-26 NOTE — Discharge Instructions (Signed)

## 2015-04-26 NOTE — ED Notes (Signed)
Pt called family member for transport home.

## 2015-04-26 NOTE — ED Notes (Signed)
Bed: ZO10 Expected date:  Expected time:  Means of arrival:  Comments: EMS- 28yo M, flank pain/groin pain/Hx of kidney stones

## 2015-04-26 NOTE — ED Provider Notes (Signed)
CSN: [id number]     Arrival date & time 04/26/15  4098 History   First MD Initiated Contact with Patient 04/26/15 [phone]     Chief Complaint  Patient presents with  . Flank Pain  . Groin Pain     (Consider location/radiation/quality/duration/timing/severity/associated sxs/prior Treatment) HPI   28yM with L flank pain. Onset this morning. Acute onset "without introduction." Pain in mid L back radiating into flank and L testicle. Vomited x1 with initial pain. Currently denies nausea. Received fentanyl, toradol and zofran from EMS.   Past Medical History  Diagnosis Date  . Osteochondritis dissecans of left knee   . Wears glasses    Past Surgical History  Procedure Laterality Date  . Appendectomy  age 29  . Knee arthroscopy Left 03/06/2015    Procedure: ARTHROSCOPY KNEE DEBRIDEMENT  of OCD LESION  AND HARVEST CARTILAGE;  Surgeon: Eugenia Mcalpine, MD;  Location: [facility];  Service: Orthopedics;  Laterality: Left;  . Chondroplasty Left 03/06/2015    Procedure: CHONDROPLASTY;  Surgeon: Eugenia Mcalpine, MD;  Location: [facility];  Service: Orthopedics;  Laterality: Left;   History reviewed. No pertinent family history. Social History  Substance Use Topics  . Smoking status: Never Smoker   . Smokeless tobacco: Never Used  . Alcohol Use: No    Review of Systems  All systems reviewed and negative, other than as noted in HPI.   Allergies  Review of patient's allergies indicates no known allergies.  Home Medications   Prior to Admission medications   Medication Sig Start Date End Date Taking? Authorizing Provider  acetaminophen (TYLENOL) 500 MG tablet Take 500 mg by mouth every 6 (six) hours as needed for mild pain.   Yes Historical Provider, MD  aspirin EC 325 MG tablet Take 1 tablet (325 mg total) by mouth 2 (two) times daily. Patient not taking: Reported on 04/26/2015 03/06/15   Bryson L Stilwell, PA-C  cephALEXin (KEFLEX) 500 MG capsule Take 1 capsule  (500 mg total) by mouth 3 (three) times daily. Patient not taking: Reported on 04/26/2015 03/06/15   Herbie Baltimore Stilwell, PA-C  HYDROcodone-acetaminophen (NORCO) 5-325 MG per tablet Take 1-2 tablets by mouth every 4 (four) hours as needed for moderate pain. Patient not taking: Reported on 04/26/2015 03/06/15   Bryson L Stilwell, PA-C  ondansetron (ZOFRAN) 4 MG tablet Take 1 tablet (4 mg total) by mouth every 8 (eight) hours as needed for nausea or vomiting. Patient not taking: Reported on 04/26/2015 03/06/15   Bryson L Stilwell, PA-C   BP 141/85 mmHg  Pulse 71  Temp(Src) 98.4 F (36.9 C) (Oral)  Resp 18  SpO2 96% Physical Exam  Constitutional: He appears well-developed and well-nourished. No distress.  HENT:  Head: Normocephalic and atraumatic.  Eyes: Conjunctivae are normal. Right eye exhibits no discharge. Left eye exhibits no discharge.  Neck: Neck supple.  Cardiovascular: Normal rate, regular rhythm and normal heart sounds.  Exam reveals no gallop and no friction rub.   No murmur heard. Pulmonary/Chest: Effort normal and breath sounds normal. No respiratory distress.  Abdominal: Soft. He exhibits no distension. There is no tenderness.  Genitourinary:  No cva tenderness  Musculoskeletal: He exhibits no edema or tenderness.  Neurological: He is alert.  Skin: Skin is warm and dry.  Psychiatric: He has a normal mood and affect. His behavior is normal. Thought content normal.  Nursing note and vitals reviewed.   ED Course  Procedures (including critical care time) Labs Review Labs Reviewed  BASIC METABOLIC PANEL - Abnormal; Notable for the following:    Potassium 3.4 (*)    Glucose, Bld 111 (*)    All other components within normal limits  URINALYSIS, ROUTINE W REFLEX MICROSCOPIC (NOT AT [facility]) - Abnormal; Notable for the following:    APPearance CLOUDY (*)    Protein, ur 30 (*)    All other components within normal limits  URINE MICROSCOPIC-ADD ON - Abnormal; Notable for the  following:    Crystals CA OXALATE CRYSTALS (*)    All other components within normal limits  CBC WITH DIFFERENTIAL/PLATELET    Imaging Review Ct Abdomen Pelvis Wo Contrast  04/26/2015   CLINICAL DATA:  Left flank pain.  EXAM: CT ABDOMEN AND PELVIS WITHOUT CONTRAST  TECHNIQUE: Multidetector CT imaging of the abdomen and pelvis was performed following the standard protocol without IV contrast.  COMPARISON:  None.  FINDINGS: Visualized lung bases appear normal. No significant osseous abnormality is noted.  No gallstones are noted. No focal abnormality is noted in the liver, spleen or pancreas on these unenhanced images. Adrenal glands appear normal. Right kidney and ureter appear normal. Mild left hydroureteronephrosis is noted secondary to 2 mm calculus at left ureterovesical junction. There is no evidence of bowel obstruction. No abnormal fluid collection is noted. Urinary bladder is decompressed. Mildly enlarged mesenteric lymph nodes are noted in the right lower quadrant most likely representing adenitis or other inflammation.  IMPRESSION: Mild left hydroureteronephrosis is noted secondary to 2 mm calculus at left ureterovesical junction.   Electronically Signed   By: Lupita Raider, M.D.   On: 04/26/2015 10:06   I have personally reviewed and evaluated these images and lab results as part of my medical decision-making.   EKG Interpretation None      MDM   Final diagnoses:  Kidney stone    29 year old male with left flank pain. History and workup consistent with left ureteral stone. Symptoms controlled. Renal function is fine. Plan expectant management/continued symptomatic treatment. It has been determined that no acute conditions requiring further emergency intervention are present at this time. The patient has been advised of the diagnosis and plan. I reviewed any labs and imaging including any potential incidental findings. We have discussed signs and symptoms that warrant return to the  ED and they are listed in the discharge instructions.      Raeford Razor, MD 04/26/15 1125

## 2015-04-26 NOTE — ED Notes (Signed)
Pt c/o L flank pain and L groin pain. Denies hematuria or dysuria.

## 2015-07-10 ENCOUNTER — Ambulatory Visit (INDEPENDENT_AMBULATORY_CARE_PROVIDER_SITE_OTHER): Payer: PPO | Admitting: Emergency Medicine

## 2015-07-10 VITALS — BP 114/80 | HR 81 | Temp 98.1°F | Resp 16 | Ht 71.0 in | Wt 221.0 lb

## 2015-07-10 DIAGNOSIS — S335XXA Sprain of ligaments of lumbar spine, initial encounter: Secondary | ICD-10-CM | POA: Diagnosis not present

## 2015-07-10 MED ORDER — PREDNISONE 10 MG (48) PO TBPK
ORAL_TABLET | ORAL | Status: DC
Start: 2015-07-10 — End: 2015-09-11

## 2015-07-10 MED ORDER — TRAMADOL HCL 50 MG PO TABS: 50.0000 mg | ORAL_TABLET | Freq: Three times a day (TID) | ORAL | Status: DC | PRN

## 2015-07-10 MED ORDER — CYCLOBENZAPRINE HCL 5 MG PO TABS: 5.0000 mg | ORAL_TABLET | Freq: Three times a day (TID) | ORAL | Status: DC | PRN

## 2015-07-10 NOTE — Patient Instructions (Signed)

## 2015-07-10 NOTE — Progress Notes (Signed)
Subjective:  Patient ID: Timothy Austin, male    DOB: 01-Sep-1985  Age: 29 y.o. MRN: [id number]  CC: Back Pain   HPI Evander Vint presents  with low back pain for the last 4 days. He's been sitting out doing work for school and studying for finals and thinks he may have overdone it. He has some pain down past his knee and the right leg laterally in the sciatic notch. He has no history of direct injury or overuse he has no history of back problems or sciatic neuritis or disc disease  History Alphons has a past medical history of Osteochondritis dissecans of left knee and Wears glasses.   He has past surgical history that includes Appendectomy (age 64); Knee arthroscopy (Left, 03/06/2015); and Chondroplasty (Left, 03/06/2015).   His  family history is not on file.  He   reports that he has never smoked. He has never used smokeless tobacco. He reports that he does not drink alcohol or use illicit drugs.  Outpatient Prescriptions Prior to Visit  Medication Sig Dispense Refill  . acetaminophen (TYLENOL) 500 MG tablet Take 500 mg by mouth every 6 (six) hours as needed for mild pain.    Marland Kitchen aspirin EC 325 MG tablet Take 1 tablet (325 mg total) by mouth 2 (two) times daily. (Patient not taking: Reported on 04/26/2015) 30 tablet 0  . cephALEXin (KEFLEX) 500 MG capsule Take 1 capsule (500 mg total) by mouth 3 (three) times daily. (Patient not taking: Reported on 04/26/2015) 12 capsule 0  . HYDROcodone-acetaminophen (NORCO) 5-325 MG per tablet Take 1-2 tablets by mouth every 4 (four) hours as needed for moderate pain. (Patient not taking: Reported on 04/26/2015) 60 tablet 0  . ondansetron (ZOFRAN) 4 MG tablet Take 1 tablet (4 mg total) by mouth every 8 (eight) hours as needed for nausea or vomiting. (Patient not taking: Reported on 04/26/2015) 20 tablet 0  . ondansetron (ZOFRAN) 4 MG tablet Take 1 tablet (4 mg total) by mouth every 6 (six) hours. (Patient not taking: Reported on 07/10/2015) 10 tablet 0  .  oxyCODONE-acetaminophen (PERCOCET/ROXICET) 5-325 MG per tablet Take 1-2 tablets by mouth every 4 (four) hours as needed for severe pain. (Patient not taking: Reported on 07/10/2015) 12 tablet 0   No facility-administered medications prior to visit.    Social History   Social History  . Marital Status: Single    Spouse Name: N/A  . Number of Children: N/A  . Years of Education: N/A   Social History Main Topics  . Smoking status: Never Smoker   . Smokeless tobacco: Never Used  . Alcohol Use: No  . Drug Use: No  . Sexual Activity: Not Asked   Other Topics Concern  . None   Social History Narrative     Review of Systems  Constitutional: Negative for fever, chills and appetite change.  HENT: Negative for congestion, ear pain, postnasal drip, sinus pressure and sore throat.   Eyes: Negative for pain and redness.  Respiratory: Negative for cough, shortness of breath and wheezing.   Cardiovascular: Negative for leg swelling.  Gastrointestinal: Negative for nausea, vomiting, abdominal pain, diarrhea, constipation and blood in stool.  Endocrine: Negative for polyuria.  Genitourinary: Negative for dysuria, urgency, frequency and flank pain.  Musculoskeletal: Positive for back pain. Negative for gait problem.  Skin: Negative for rash.  Neurological: Negative for weakness and headaches.  Psychiatric/Behavioral: Negative for confusion and decreased concentration. The patient is not nervous/anxious.     Objective:  BP 114/80 mmHg  Pulse 81  Temp(Src) 98.1 F (36.7 C) (Oral)  Resp 16  Ht 5\' 11"  (1.803 m)  Wt 221 lb (100.245 kg)  BMI 30.84 kg/m2  SpO2 98%  Physical Exam  Constitutional: He is oriented to person, place, and time. He appears well-developed and well-nourished. No distress.  HENT:  Head: Normocephalic and atraumatic.  Right Ear: External ear normal.  Left Ear: External ear normal.  Nose: Nose normal.  Eyes: Conjunctivae and EOM are normal. Pupils are equal,  round, and reactive to light. No scleral icterus.  Neck: Normal range of motion. Neck supple. No tracheal deviation present.  Cardiovascular: Normal rate, regular rhythm and normal heart sounds.   Pulmonary/Chest: Effort normal. No respiratory distress. He has no wheezes. He has no rales.  Abdominal: He exhibits no mass. There is no tenderness. There is no rebound and no guarding.  Musculoskeletal: He exhibits no edema.       Lumbar back: He exhibits tenderness and spasm.  Lymphadenopathy:    He has no cervical adenopathy.  Neurological: He is alert and oriented to person, place, and time. Coordination normal.  Skin: Skin is warm and dry. No rash noted.  Psychiatric: He has a normal mood and affect. His behavior is normal.      Assessment & Plan:   Daniel NonesSaud was seen today for back pain.  Diagnoses and all orders for this visit:  Sprain of lumbar region, initial encounter  Other orders -     predniSONE (STERAPRED UNI-PAK 48 TAB) 10 MG (48) TBPK tablet; Follow directions on package -     cyclobenzaprine (FLEXERIL) 5 MG tablet; Take 1 tablet (5 mg total) by mouth 3 (three) times daily as needed for muscle spasms. -     traMADol (ULTRAM) 50 MG tablet; Take 1 tablet (50 mg total) by mouth every 8 (eight) hours as needed.  I am having Mr. Lawhorn start on predniSONE, cyclobenzaprine, and traMADol. I am also having him maintain his aspirin EC, HYDROcodone-acetaminophen, cephALEXin, ondansetron, acetaminophen, oxyCODONE-acetaminophen, and ondansetron.  Meds ordered this encounter  Medications  . predniSONE (STERAPRED UNI-PAK 48 TAB) 10 MG (48) TBPK tablet    Sig: Follow directions on package    Dispense:  48 tablet    Refill:  0  . cyclobenzaprine (FLEXERIL) 5 MG tablet    Sig: Take 1 tablet (5 mg total) by mouth 3 (three) times daily as needed for muscle spasms.    Dispense:  30 tablet    Refill:  0  . traMADol (ULTRAM) 50 MG tablet    Sig: Take 1 tablet (50 mg total) by mouth every  8 (eight) hours as needed.    Dispense:  30 tablet    Refill:  0    Appropriate red flag conditions were discussed with the patient as well as actions that should be taken.  Patient expressed his understanding.  Follow-up: Return if symptoms worsen or fail to improve.  Carmelina DaneAnderson, Jeffery S, MD

## 2015-09-10 ENCOUNTER — Telehealth: Payer: Self-pay | Admitting: General Practice

## 2015-09-10 NOTE — Telephone Encounter (Signed)
Called interpreter services and had them try to contact pt. LMOVM to return call to update chart.

## 2015-09-11 ENCOUNTER — Encounter: Payer: Self-pay | Admitting: Medical

## 2015-09-11 ENCOUNTER — Ambulatory Visit (INDEPENDENT_AMBULATORY_CARE_PROVIDER_SITE_OTHER): Payer: PPO | Admitting: Medical

## 2015-09-11 ENCOUNTER — Ambulatory Visit (HOSPITAL_BASED_OUTPATIENT_CLINIC_OR_DEPARTMENT_OTHER)
Admission: RE | Admit: 2015-09-11 | Discharge: 2015-09-11 | Disposition: A | Discharge status: Home / Self Care | Payer: PPO | Source: Ambulatory Visit | Attending: Medical | Admitting: Medical

## 2015-09-11 VITALS — BP 118/80 | HR 87 | Temp 98.1°F | Ht 71.0 in | Wt 233.0 lb

## 2015-09-11 DIAGNOSIS — R5383 Other fatigue: Secondary | ICD-10-CM | POA: Diagnosis not present

## 2015-09-11 DIAGNOSIS — R0789 Other chest pain: Secondary | ICD-10-CM | POA: Diagnosis not present

## 2015-09-11 DIAGNOSIS — R0600 Dyspnea, unspecified: Secondary | ICD-10-CM | POA: Insufficient documentation

## 2015-09-11 DIAGNOSIS — R0781 Pleurodynia: Secondary | ICD-10-CM

## 2015-09-11 DIAGNOSIS — R7989 Other specified abnormal findings of blood chemistry: Secondary | ICD-10-CM

## 2015-09-11 DIAGNOSIS — M940 Chondrocostal junction syndrome [Tietze]: Secondary | ICD-10-CM

## 2015-09-11 LAB — CBC WITH DIFFERENTIAL/PLATELET
Basophils Absolute: 0 10*3/uL (ref 0.0–0.1)
Basophils Relative: 0.4 % (ref 0.0–3.0)
Eosinophils Absolute: 0.1 10*3/uL (ref 0.0–0.7)
Eosinophils Relative: 2.2 % (ref 0.0–5.0)
HEMATOCRIT: 42.5 % (ref 39.0–52.0)
HEMOGLOBIN: 14.3 g/dL (ref 13.0–17.0)
LYMPHS PCT: 39.2 % (ref 12.0–46.0)
Lymphs Abs: 2.5 10*3/uL (ref 0.7–4.0)
MCHC: 33.6 g/dL (ref 30.0–36.0)
MCV: 87.1 fl (ref 78.0–100.0)
MONOS PCT: 6.8 % (ref 3.0–12.0)
Monocytes Absolute: 0.4 10*3/uL (ref 0.1–1.0)
Neutro Abs: 3.2 10*3/uL (ref 1.4–7.7)
Neutrophils Relative %: 51.4 % (ref 43.0–77.0)
Platelets: 188 10*3/uL (ref 150.0–400.0)
RBC: 4.88 Mil/uL (ref 4.22–5.81)
RDW: 12.8 % (ref 11.5–15.5)
WBC: 6.3 10*3/uL (ref 4.0–10.5)

## 2015-09-11 LAB — VITAMIN D 25 HYDROXY (VIT D DEFICIENCY, FRACTURES): VITD: 13.9 ng/mL — ABNORMAL LOW (ref 30.00–100.00)

## 2015-09-11 LAB — COMPREHENSIVE METABOLIC PANEL
ALBUMIN: 4.6 g/dL (ref 3.5–5.2)
ALT: 72 U/L — ABNORMAL HIGH (ref 0–53)
AST: 38 U/L — ABNORMAL HIGH (ref 0–37)
Alkaline Phosphatase: 67 U/L (ref 39–117)
BUN: 18 mg/dL (ref 6–23)
CHLORIDE: 105 meq/L (ref 96–112)
CO2: 26 meq/L (ref 19–32)
Calcium: 9.5 mg/dL (ref 8.4–10.5)
Creatinine, Ser: 0.86 mg/dL (ref 0.40–1.50)
GFR: 110.86 mL/min (ref >60.00)
Glucose, Bld: 98 mg/dL (ref 70–99)
Potassium: 3.7 mEq/L (ref 3.5–5.1)
SODIUM: 139 meq/L (ref 135–145)
Total Bilirubin: 0.6 mg/dL (ref 0.2–1.2)
Total Protein: 7.9 g/dL (ref 6.0–8.3)

## 2015-09-11 LAB — TSH: TSH: 6.5 u[IU]/mL — AB (ref 0.35–4.50)

## 2015-09-11 LAB — VITAMIN B12: Vitamin B-12: 279 pg/mL (ref 211–911)

## 2015-09-11 MED ORDER — DICLOFENAC SODIUM 75 MG PO TBEC: 75.0000 mg | DELAYED_RELEASE_TABLET | Freq: Two times a day (BID) | ORAL | Status: DC

## 2015-09-11 MED ORDER — ALBUTEROL SULFATE HFA 108 (90 BASE) MCG/ACT IN AERS: 2.0000 | INHALATION_SPRAY | Freq: Four times a day (QID) | RESPIRATORY_TRACT | Status: DC | PRN

## 2015-09-11 NOTE — Progress Notes (Signed)
Subjective:    Patient ID: Timothy Austin, male    DOB: Dec 13, 1985, 30 y.o.   MRN: [id number]  HPI  I have reviewed pt PMH, PSH, FH, Social History and Surgical History.  Pt student, does not exercise much, Runs and swims, 1-2 times a day cup of coffee, diet healthy, married with 1 child.  Surgery limited to appendix and left knee surgery.  Pt mentioned some fatigue. About 5-6 years. Pt states weight is stable.   Also he has chronic mild chest wall pain on palpation. Pain on palpation easily. For 10 years. Light pain.   Also about 2-3 times a year when exposed to cold air will have pain on breathing. Like breathing restricted. He does not cough. Pt states feels like it is hard to breath during those events. No current respiratory symptoms.     Review of Systems  Constitutional: Positive for fatigue. Negative for fever and chills.  Respiratory: Negative for cough, chest tightness and shortness of breath.        Cold weather possible pain breathing and difficulty. Not presently.  Cardiovascular: Negative for chest pain and palpitations.       Atypical chest wall pain. Pain over ribs. On palpation.  Gastrointestinal: Negative for abdominal pain.  Musculoskeletal: Negative for back pain.  Neurological: Positive for dizziness. Negative for speech difficulty, weakness, numbness and headaches.       He also mentioned some occasional dizziness and he will see ent tomorrow.  Hematological: Negative for adenopathy. Does not bruise/bleed easily.  Psychiatric/Behavioral: Negative for behavioral problems and confusion.    Past Medical History  Diagnosis Date  . Osteochondritis dissecans of left knee   . Wears glasses     Social History   Social History  . Marital Status: Married    Spouse Name: N/A  . Number of Children: N/A  . Years of Education: N/A   Occupational History  . Not on file.   Social History Main Topics  . Smoking status: Never Smoker   . Smokeless tobacco:  Never Used  . Alcohol Use: No  . Drug Use: No  . Sexual Activity: Yes   Other Topics Concern  . Not on file   Social History Narrative    Past Surgical History  Procedure Laterality Date  . Appendectomy  age 45  . Knee arthroscopy Left 03/06/2015    Procedure: ARTHROSCOPY KNEE DEBRIDEMENT  of OCD LESION  AND HARVEST CARTILAGE;  Surgeon: Eugenia Mcalpine, MD;  Location: [facility] [place];  Service: Orthopedics;  Laterality: Left;  . Chondroplasty Left 03/06/2015    Procedure: CHONDROPLASTY;  Surgeon: Eugenia Mcalpine, MD;  Location: [facility];  Service: Orthopedics;  Laterality: Left;    Family History  Problem Relation Age of Onset  . Hypertension Mother   . Diabetes Mother   . Hypertension Father   . Diabetes Father     No Known Allergies  No current outpatient prescriptions on file prior to visit.   No current facility-administered medications on file prior to visit.    BP 118/80 mmHg  Pulse 87  Temp(Src) 98.1 F (36.7 C) (Oral)  Ht  (1.803 m)  Wt 233 lb (105.688 kg)  BMI 32.51 kg/m2  SpO2 98%      Objective:   Physical Exam   General Mental Status- Alert. General Appearance- Not in acute distress.   Skin General: Color- Normal Color. Moisture- Normal Moisture.  Neck Carotid Arteries- Normal color. Moisture- Normal Moisture. No carotid  bruits. No JVD.  Chest and Lung Exam Auscultation: Breath Sounds:-Normal.  Anterior thorax- mild palpation over both lower rib area causes moderate 6/10 level pain.  Cardiovascular Auscultation:Rythm- Regular. Murmurs & Other Heart Sounds:Auscultation of the heart reveals- No Murmurs.  Abdomen Inspection:-Inspeection Normal. Palpation/Percussion:Note:No mass. Palpation and Percussion of the abdomen reveal- Non Tender, Non Distended + BS, no rebound or guarding.   Neurologic Cranial Nerve exam:- CN III-XII intact(No nystagmus), symmetric smile. Strength:- 5/5 equal and symmetric  strength both upper and lower extremities.     Assessment & Plan:  For chest wall and rib pain. We got ekg today which looks normal. Chest xray will done as well.  For your chest wall pain this may be cartlidge inflammation pain. Will give diclofenac for pain and inflammation. See how your respond to treatment.  For your pain on breathing with cold weather and some difficulty will see how your lungs look by xray and I want you to try inhaler if you have recurrent episode. Let us know if that resolves breathing symptoms.  For fatigue/tired feeling get cbc, cmp, tsh and b12 level.  I decided not to do complete physical today. You could schedule that in near future and get cholesterol checked. That day would do urine study as well.  Follow up in 10-14 days or as needed

## 2015-09-11 NOTE — Patient Instructions (Addendum)
For chest wall and rib pain. We got ekg today which looks normal. Chest xray will done as well.  For your chest wall pain this may be cartridge inflammation pain. Will give diclofenac for pain and inflammation. See how your respond to treatment.  For your pain on breathing with cold weather and some difficulty will see how your lungs look by xray and I want you to try inhaler if you have recurrent episode. Let us know if that resolves breathing symptoms.  For fatigue/tired feeling get cbc, cmp, tsh and b12 level.  I decided not to do complete physical today. You could schedule that in near future and get cholesterol checked. That day would do urine study as well.  Follow up in 10-14 days or as needed

## 2015-09-11 NOTE — Progress Notes (Signed)
Pre visit review using our clinic review tool, if applicable. No additional management support is needed unless otherwise documented below in the visit note. 

## 2015-09-12 ENCOUNTER — Other Ambulatory Visit: Payer: PPO

## 2015-09-12 ENCOUNTER — Telehealth: Payer: Self-pay | Admitting: Medical

## 2015-09-12 DIAGNOSIS — E039 Hypothyroidism, unspecified: Secondary | ICD-10-CM

## 2015-09-12 DIAGNOSIS — I519 Heart disease, unspecified: Principal | ICD-10-CM

## 2015-09-12 LAB — T4: T4, Total: 7.7 ug/dL (ref 4.5–12.0)

## 2015-09-12 MED ORDER — VITAMIN D (ERGOCALCIFEROL) 1.25 MG (50000 UNIT) PO CAPS: 50000.0000 [IU] | ORAL_CAPSULE | ORAL | Status: DC

## 2015-09-12 MED ORDER — LEVOTHYROXINE SODIUM 75 MCG PO TABS: 75.0000 ug | ORAL_TABLET | Freq: Every day | ORAL | Status: DC

## 2015-09-12 NOTE — Telephone Encounter (Signed)
Pt was advised and he voices understanding.

## 2015-09-12 NOTE — Telephone Encounter (Signed)
I did send in both vitamin d and levothyroxine. Let him know low thyroid may be reason for his fatigue.

## 2015-09-12 NOTE — Addendum Note (Signed)
Addended by: Neldon Labella on: 09/12/2015 09:06 AM   Modules accepted: Orders

## 2015-09-13 ENCOUNTER — Telehealth: Payer: Self-pay | Admitting: Medical

## 2015-09-13 NOTE — Telephone Encounter (Signed)
Yesterday when I discussed with pt plan to give levothyroxine he asked if there were any pork product in tablets. I told him I did not think so but was not 100%sure. I advised him for his peace of mind sake and to know for sure that he ask the pharmacy when he fills the tab. Next day I called down to our pharmacist to get their opinion. Our pharmacist think levothyroxine might have some pork product and that branded synthroid was likely best option. So I will call pharmacy and notify them of pt concern. Also notify pt. I tried to call pharmacy and they were closed. Will try again later.

## 2015-09-13 NOTE — Telephone Encounter (Signed)
I talked with his pharmacy. I asked them to review that no pork products in his levothyroxine that I ordered. He reviewed contents and assured me with confidence no pork products.

## 2015-09-21 ENCOUNTER — Encounter: Payer: Self-pay | Admitting: Medical

## 2015-09-21 ENCOUNTER — Ambulatory Visit (INDEPENDENT_AMBULATORY_CARE_PROVIDER_SITE_OTHER): Payer: PPO | Admitting: Medical

## 2015-09-21 VITALS — BP 110/70 | HR 78 | Temp 98.1°F | Ht 71.0 in | Wt 223.0 lb

## 2015-09-21 DIAGNOSIS — R7989 Other specified abnormal findings of blood chemistry: Secondary | ICD-10-CM | POA: Diagnosis not present

## 2015-09-21 DIAGNOSIS — Z0000 Encounter for general adult medical examination without abnormal findings: Secondary | ICD-10-CM | POA: Insufficient documentation

## 2015-09-21 DIAGNOSIS — R5383 Other fatigue: Secondary | ICD-10-CM

## 2015-09-21 LAB — POC URINALSYSI DIPSTICK (AUTOMATED)
Bilirubin, UA: NEGATIVE
GLUCOSE UA: NEGATIVE
Ketones, UA: NEGATIVE
Leukocytes, UA: NEGATIVE
NITRITE UA: NEGATIVE
PH UA: 6
Protein, UA: NEGATIVE
RBC UA: NEGATIVE
UROBILINOGEN UA: 0.2

## 2015-09-21 LAB — TSH: TSH: 3.01 u[IU]/mL (ref 0.35–4.50)

## 2015-09-21 LAB — VITAMIN D 25 HYDROXY (VIT D DEFICIENCY, FRACTURES): VITD: 19.27 ng/mL — AB (ref 30.00–100.00)

## 2015-09-21 NOTE — Patient Instructions (Signed)
Wellness examination Will get cmp, lipid and ua.   Would recommend some daily exercises. Moderate but don't overstress knee.  Will repeat your tsh and t4 in 4 wks.   Preventive Care for Adults, Male A healthy lifestyle and preventive care can promote health and wellness. Preventive health guidelines for men include the following key practices:  A routine yearly physical is a good way to check with your health care provider about your health and preventative screening. It is a chance to share any concerns and updates on your health and to receive a thorough exam.  Visit your dentist for a routine exam and preventative care every 6 months. Brush your teeth twice a day and floss once a day. Good oral hygiene prevents tooth decay and gum disease.  The frequency of eye exams is based on your age, health, family medical history, use of contact lenses, and other factors. Follow your health care provider's recommendations for frequency of eye exams.  Eat a healthy diet. Foods such as vegetables, fruits, whole grains, low-fat dairy products, and lean protein foods contain the nutrients you need without too many calories. Decrease your intake of foods high in solid fats, added sugars, and salt. Eat the right amount of calories for you.Get information about a proper diet from your health care provider, if necessary.  Regular physical exercise is one of the most important things you can do for your health. Most adults should get at least 150 minutes of moderate-intensity exercise (any activity that increases your heart rate and causes you to sweat) each week. In addition, most adults need muscle-strengthening exercises on 2 or more days a week.  Maintain a healthy weight. The body mass index (BMI) is a screening tool to identify possible weight problems. It provides an estimate of body fat based on height and weight. Your health care provider can find your BMI and can help you achieve or maintain a  healthy weight.For adults 20 years and older:  A BMI below 18.5 is considered underweight.  A BMI of 18.5 to 24.9 is normal.  A BMI of 25 to 29.9 is considered overweight.  A BMI of 30 and above is considered obese.  Maintain normal blood lipids and cholesterol levels by exercising and minimizing your intake of saturated fat. Eat a balanced diet with plenty of fruit and vegetables. Blood tests for lipids and cholesterol should begin at age 60 and be repeated every 5 years. If your lipid or cholesterol levels are high, you are over 50, or you are at high risk for heart disease, you may need your cholesterol levels checked more frequently.Ongoing high lipid and cholesterol levels should be treated with medicines if diet and exercise are not working.  If you smoke, find out from your health care provider how to quit. If you do not use tobacco, do not start.  Lung cancer screening is recommended for adults aged 13-80 years who are at high risk for developing lung cancer because of a history of smoking. A yearly low-dose CT scan of the lungs is recommended for people who have at least a 30-pack-year history of smoking and are a current smoker or have quit within the past 15 years. A pack year of smoking is smoking an average of 1 pack of cigarettes a day for 1 year (for example: 1 pack a day for 30 years or 2 packs a day for 15 years). Yearly screening should continue until the smoker has stopped smoking for at least 15 years.  Yearly screening should be stopped for people who develop a health problem that would prevent them from having lung cancer treatment.  If you choose to drink alcohol, do not have more than 2 drinks per day. One drink is considered to be 12 ounces (355 mL) of beer, 5 ounces (148 mL) of wine, or 1.5 ounces (44 mL) of liquor.  Avoid use of street drugs. Do not share needles with anyone. Ask for help if you need support or instructions about stopping the use of drugs.  High blood  pressure causes heart disease and increases the risk of stroke. Your blood pressure should be checked at least every 1-2 years. Ongoing high blood pressure should be treated with medicines, if weight loss and exercise are not effective.  If you are 87-83 years old, ask your health care provider if you should take aspirin to prevent heart disease.  Diabetes screening is done by taking a blood sample to check your blood glucose level after you have not eaten for a certain period of time (fasting). If you are not overweight and you do not have risk factors for diabetes, you should be screened once every 3 years starting at age 20. If you are overweight or obese and you are 45-31 years of age, you should be screened for diabetes every year as part of your cardiovascular risk assessment.  Colorectal cancer can be detected and often prevented. Most routine colorectal cancer screening begins at the age of 44 and continues through age 6. However, your health care provider may recommend screening at an earlier age if you have risk factors for colon cancer. On a yearly basis, your health care provider may provide home test kits to check for hidden blood in the stool. Use of a small camera at the end of a tube to directly examine the colon (sigmoidoscopy or colonoscopy) can detect the earliest forms of colorectal cancer. Talk to your health care provider about this at age 15, when routine screening begins. Direct exam of the colon should be repeated every 5-10 years through age 73, unless early forms of precancerous polyps or small growths are found.  People who are at an increased risk for hepatitis B should be screened for this virus. You are considered at high risk for hepatitis B if:  You were born in a country where hepatitis B occurs often. Talk with your health care provider about which countries are considered high risk.  Your parents were born in a high-risk country and you have not received a shot to  protect against hepatitis B (hepatitis B vaccine).  You have HIV or AIDS.  You use needles to inject street drugs.  You live with, or have sex with, someone who has hepatitis B.  You are a man who has sex with other men (MSM).  You get hemodialysis treatment.  You take certain medicines for conditions such as cancer, organ transplantation, and autoimmune conditions.  Hepatitis C blood testing is recommended for all people born from [birth year] through 1965 and any individual with known risks for hepatitis C.  Practice safe sex. Use condoms and avoid high-risk sexual practices to reduce the spread of sexually transmitted infections (STIs). STIs include gonorrhea, chlamydia, syphilis, trichomonas, herpes, HPV, and human immunodeficiency virus (HIV). Herpes, HIV, and HPV are viral illnesses that have no cure. They can result in disability, cancer, and death.  If you are a man who has sex with other men, you should be screened at least once per year  for:  HIV.  Urethral, rectal, and pharyngeal infection of gonorrhea, chlamydia, or both.  If you are at risk of being infected with HIV, it is recommended that you take a prescription medicine daily to prevent HIV infection. This is called preexposure prophylaxis (PrEP). You are considered at risk if:  You are a man who has sex with other men (MSM) and have other risk factors.  You are a heterosexual man, are sexually active, and are at increased risk for HIV infection.  You take drugs by injection.  You are sexually active with a partner who has HIV.  Talk with your health care provider about whether you are at high risk of being infected with HIV. If you choose to begin PrEP, you should first be tested for HIV. You should then be tested every 3 months for as long as you are taking PrEP.  A one-time screening for abdominal aortic aneurysm (AAA) and surgical repair of large AAAs by ultrasound are recommended for men ages 56 to 81 years who are  current or former smokers.  Healthy men should no longer receive prostate-specific antigen (PSA) blood tests as part of routine cancer screening. Talk with your health care provider about prostate cancer screening.  Testicular cancer screening is not recommended for adult males who have no symptoms. Screening includes self-exam, a health care provider exam, and other screening tests. Consult with your health care provider about any symptoms you have or any concerns you have about testicular cancer.  Use sunscreen. Apply sunscreen liberally and repeatedly throughout the day. You should seek shade when your shadow is shorter than you. Protect yourself by wearing long sleeves, pants, a wide-brimmed hat, and sunglasses year round, whenever you are outdoors.  Once a month, do a whole-body skin exam, using a mirror to look at the skin on your back. Tell your health care provider about new moles, moles that have irregular borders, moles that are larger than a pencil eraser, or moles that have changed in shape or color.  Stay current with required vaccines (immunizations).  Influenza vaccine. All adults should be immunized every year.  Tetanus, diphtheria, and acellular pertussis (Td, Tdap) vaccine. An adult who has not previously received Tdap or who does not know his vaccine status should receive 1 dose of Tdap. This initial dose should be followed by tetanus and diphtheria toxoids (Td) booster doses every 10 years. Adults with an unknown or incomplete history of completing a 3-dose immunization series with Td-containing vaccines should begin or complete a primary immunization series including a Tdap dose. Adults should receive a Td booster every 10 years.  Varicella vaccine. An adult without evidence of immunity to varicella should receive 2 doses or a second dose if he has previously received 1 dose.  Human papillomavirus (HPV) vaccine. Males aged 11-21 years who have not received the vaccine  previously should receive the 3-dose series. Males aged 22-26 years may be immunized. Immunization is recommended through the age of 27 years for any male who has sex with males and did not get any or all doses earlier. Immunization is recommended for any person with an immunocompromised condition through the age of 4 years if he did not get any or all doses earlier. During the 3-dose series, the second dose should be obtained 4-8 weeks after the first dose. The third dose should be obtained 24 weeks after the first dose and 16 weeks after the second dose.  Zoster vaccine. One dose is recommended for adults aged  60 years or older unless certain conditions are present.  Measles, mumps, and rubella (MMR) vaccine. Adults born before [birth year] generally are considered immune to measles and mumps. Adults born in [birth year] or later should have 1 or more doses of MMR vaccine unless there is a contraindication to the vaccine or there is laboratory evidence of immunity to each of the three diseases. A routine second dose of MMR vaccine should be obtained at least 28 days after the first dose for students attending postsecondary schools, health care workers, or international travelers. People who received inactivated measles vaccine or an unknown type of measles vaccine during 1963-1967 should receive 2 doses of MMR vaccine. People who received inactivated mumps vaccine or an unknown type of mumps vaccine before 1979 and are at high risk for mumps infection should consider immunization with 2 doses of MMR vaccine. Unvaccinated health care workers born before [birth year] who lack laboratory evidence of measles, mumps, or rubella immunity or laboratory confirmation of disease should consider measles and mumps immunization with 2 doses of MMR vaccine or rubella immunization with 1 dose of MMR vaccine.  Pneumococcal 13-valent conjugate (PCV13) vaccine. When indicated, a person who is uncertain of his immunization history and has no record  of immunization should receive the PCV13 vaccine. All adults 21 years of age and older should receive this vaccine. An adult aged 65 years or older who has certain medical conditions and has not been previously immunized should receive 1 dose of PCV13 vaccine. This PCV13 should be followed with a dose of pneumococcal polysaccharide (PPSV23) vaccine. Adults who are at high risk for pneumococcal disease should obtain the PPSV23 vaccine at least 8 weeks after the dose of PCV13 vaccine. Adults older than 31 years of age who have normal immune system function should obtain the PPSV23 vaccine dose at least 1 year after the dose of PCV13 vaccine.  Pneumococcal polysaccharide (PPSV23) vaccine. When PCV13 is also indicated, PCV13 should be obtained first. All adults aged 73 years and older should be immunized. An adult younger than age 56 years who has certain medical conditions should be immunized. Any person who resides in a nursing home or long-term care facility should be immunized. An adult smoker should be immunized. People with an immunocompromised condition and certain other conditions should receive both PCV13 and PPSV23 vaccines. People with human immunodeficiency virus (HIV) infection should be immunized as soon as possible after diagnosis. Immunization during chemotherapy or radiation therapy should be avoided. Routine use of PPSV23 vaccine is not recommended for American Indians, [place] Natives, or people younger than 65 years unless there are medical conditions that require PPSV23 vaccine. When indicated, people who have unknown immunization and have no record of immunization should receive PPSV23 vaccine. One-time revaccination 5 years after the first dose of PPSV23 is recommended for people aged 19-64 years who have chronic kidney failure, nephrotic syndrome, asplenia, or immunocompromised conditions. People who received 1-2 doses of PPSV23 before age 10 years should receive another dose of PPSV23 vaccine  at age 62 years or later if at least 5 years have passed since the previous dose. Doses of PPSV23 are not needed for people immunized with PPSV23 at or after age 52 years.  Meningococcal vaccine. Adults with asplenia or persistent complement component deficiencies should receive 2 doses of quadrivalent meningococcal conjugate (MenACWY-D) vaccine. The doses should be obtained at least 2 months apart. Microbiologists working with certain meningococcal bacteria, [employer] recruits, people at risk during an outbreak, and people who travel to  or live in countries with a high rate of meningitis should be immunized. A first-year college student up through age 74 years who is living in a residence hall should receive a dose if he did not receive a dose on or after his 16th birthday. Adults who have certain high-risk conditions should receive one or more doses of vaccine.  Hepatitis A vaccine. Adults who wish to be protected from this disease, have chronic liver disease, work with hepatitis A-infected animals, work in hepatitis A research labs, or travel to or work in countries with a high rate of hepatitis A should be immunized. Adults who were previously unvaccinated and who anticipate close contact with an international adoptee during the first 60 days after arrival in the [place] from a country with a high rate of hepatitis A should be immunized.  Hepatitis B vaccine. Adults should be immunized if they wish to be protected from this disease, are under age 12 years and have diabetes, have chronic liver disease, have had more than one sex partner in the past 6 months, may be exposed to blood or other infectious body fluids, are household contacts or sex partners of hepatitis B positive people, are clients or workers in certain care facilities, or travel to or work in countries with a high rate of hepatitis B.  Haemophilus influenzae type b (Hib) vaccine. A previously unvaccinated person with asplenia or sickle  cell disease or having a scheduled splenectomy should receive 1 dose of Hib vaccine. Regardless of previous immunization, a recipient of a hematopoietic stem cell transplant should receive a 3-dose series 6-12 months after his successful transplant. Hib vaccine is not recommended for adults with HIV infection. Preventive Service / Frequency Ages 34 to 43  Blood pressure check.** / Every 3-5 years.  Lipid and cholesterol check.** / Every 5 years beginning at age 57.  Hepatitis C blood test.** / For any individual with known risks for hepatitis C.  Skin self-exam. / Monthly.  Influenza vaccine. / Every year.  Tetanus, diphtheria, and acellular pertussis (Tdap, Td) vaccine.** / Consult your health care provider. 1 dose of Td every 10 years.  Varicella vaccine.** / Consult your health care provider.  HPV vaccine. / 3 doses over 6 months, if 61 or younger.  Measles, mumps, rubella (MMR) vaccine.** / You need at least 1 dose of MMR if you were born in 1957 or later. You may also need a second dose.  Pneumococcal 13-valent conjugate (PCV13) vaccine.** / Consult your health care provider.  Pneumococcal polysaccharide (PPSV23) vaccine.** / 1 to 2 doses if you smoke cigarettes or if you have certain conditions.  Meningococcal vaccine.** / 1 dose if you are age 59 to 8 years and a Market researcher living in a residence hall, or have one of several medical conditions. You may also need additional booster doses.  Hepatitis A vaccine.** / Consult your health care provider.  Hepatitis B vaccine.** / Consult your health care provider.  Haemophilus influenzae type b (Hib) vaccine.** / Consult your health care provider. Ages 5 to 14  Blood pressure check.** / Every year.  Lipid and cholesterol check.** / Every 5 years beginning at age 53.  Lung cancer screening. / Every year if you are aged 83-80 years and have a 30-pack-year history of smoking and currently smoke or have quit within  the past 15 years. Yearly screening is stopped once you have quit smoking for at least 15 years or develop a health problem that would prevent  you from having lung cancer treatment.  Fecal occult blood test (FOBT) of stool. / Every year beginning at age 47 and continuing until age 50. You may not have to do this test if you get a colonoscopy every 10 years.  Flexible sigmoidoscopy** or colonoscopy.** / Every 5 years for a flexible sigmoidoscopy or every 10 years for a colonoscopy beginning at age 57 and continuing until age 47.  Hepatitis C blood test.** / For all people born from [birth year] through 1965 and any individual with known risks for hepatitis C.  Skin self-exam. / Monthly.  Influenza vaccine. / Every year.  Tetanus, diphtheria, and acellular pertussis (Tdap/Td) vaccine.** / Consult your health care provider. 1 dose of Td every 10 years.  Varicella vaccine.** / Consult your health care provider.  Zoster vaccine.** / 1 dose for adults aged 24 years or older.  Measles, mumps, rubella (MMR) vaccine.** / You need at least 1 dose of MMR if you were born in 1958 or later. You may also need a second dose.  Pneumococcal 13-valent conjugate (PCV13) vaccine.** / Consult your health care provider.  Pneumococcal polysaccharide (PPSV23) vaccine.** / 1 to 2 doses if you smoke cigarettes or if you have certain conditions.  Meningococcal vaccine.** / Consult your health care provider.  Hepatitis A vaccine.** / Consult your health care provider.  Hepatitis B vaccine.** / Consult your health care provider.  Haemophilus influenzae type b (Hib) vaccine.** / Consult your health care provider. Ages 69 and over  Blood pressure check.** / Every year.  Lipid and cholesterol check.**/ Every 5 years beginning at age 58.  Lung cancer screening. / Every year if you are aged 53-80 years and have a 30-pack-year history of smoking and currently smoke or have quit within the past 15 years. Yearly screening  is stopped once you have quit smoking for at least 15 years or develop a health problem that would prevent you from having lung cancer treatment.  Fecal occult blood test (FOBT) of stool. / Every year beginning at age 25 and continuing until age 50. You may not have to do this test if you get a colonoscopy every 10 years.  Flexible sigmoidoscopy** or colonoscopy.** / Every 5 years for a flexible sigmoidoscopy or every 10 years for a colonoscopy beginning at age 66 and continuing until age 63.  Hepatitis C blood test.** / For all people born from [birth year] through 1966 and any individual with known risks for hepatitis C.  Abdominal aortic aneurysm (AAA) screening.** / A one-time screening for ages 28 to 91 years who are current or former smokers.  Skin self-exam. / Monthly.  Influenza vaccine. / Every year.  Tetanus, diphtheria, and acellular pertussis (Tdap/Td) vaccine.** / 1 dose of Td every 10 years.  Varicella vaccine.** / Consult your health care provider.  Zoster vaccine.** / 1 dose for adults aged 38 years or older.  Pneumococcal 13-valent conjugate (PCV13) vaccine.** / 1 dose for all adults aged 67 years and older.  Pneumococcal polysaccharide (PPSV23) vaccine.** / 1 dose for all adults aged 54 years and older.  Meningococcal vaccine.** / Consult your health care provider.  Hepatitis A vaccine.** / Consult your health care provider.  Hepatitis B vaccine.** / Consult your health care provider.  Haemophilus influenzae type b (Hib) vaccine.** / Consult your health care provider. **Family history and personal history of risk and conditions may change your health care provider's recommendations.   This information is not intended to replace advice given to you by your  health care provider. Make sure you discuss any questions you have with your health care provider.   Document Released: 09/30/2001 Document Revised: 08/25/2014 Document Reviewed: 12/30/2010 Elsevier Interactive Patient  Education [employer].

## 2015-09-21 NOTE — Assessment & Plan Note (Signed)
Will get cmp, lipid and ua.

## 2015-09-21 NOTE — Progress Notes (Signed)
Pre visit review using our clinic review tool, if applicable. No additional management support is needed unless otherwise documented below in the visit note. 

## 2015-09-21 NOTE — Progress Notes (Signed)
Subjective:    Patient ID: Timothy Austin, male    DOB: 06-Jul-1986, 30 y.o.   MRN: [id number]  HPI  Pt want fasting lipid and phyiscial today. Pt student, does not exercise much, Runs and swims, 1-2 times a day cup of coffee, diet healthy, married with 1 child.  Pt in for follow up. Pt has started on low dose thyroid. Pt is feeling about the same in terms of fatigue. He was not anemic.  Pt vitamin D level was low but now on vitamin D.   Pt had tdap last year for his school  Pt does not take flu vaccine.   Review of Systems  Constitutional: Positive for fatigue. Negative for fever and chills.  Respiratory: Negative for cough, choking, chest tightness, shortness of breath and wheezing.   Cardiovascular: Negative for chest pain and palpitations.  Gastrointestinal: Negative for abdominal pain, constipation and blood in stool.  Musculoskeletal: Negative for back pain.  Skin: Negative for rash.  Psychiatric/Behavioral: Negative for behavioral problems and confusion.    Past Medical History  Diagnosis Date  . Osteochondritis dissecans of left knee   . Wears glasses     Social History   Social History  . Marital Status: Married    Spouse Name: N/A  . Number of Children: N/A  . Years of Education: N/A   Occupational History  . Not on file.   Social History Main Topics  . Smoking status: Never Smoker   . Smokeless tobacco: Never Used  . Alcohol Use: No  . Drug Use: No  . Sexual Activity: Yes   Other Topics Concern  . Not on file   Social History Narrative    Past Surgical History  Procedure Laterality Date  . Appendectomy  age 62  . Knee arthroscopy Left 03/06/2015    Procedure: ARTHROSCOPY KNEE DEBRIDEMENT  of OCD LESION  AND HARVEST CARTILAGE;  Surgeon: Eugenia Mcalpine, MD;  Location: [facility];  Service: Orthopedics;  Laterality: Left;  . Chondroplasty Left 03/06/2015    Procedure: CHONDROPLASTY;  Surgeon: Eugenia Mcalpine, MD;  Location:  [facility];  Service: Orthopedics;  Laterality: Left;    Family History  Problem Relation Age of Onset  . Hypertension Mother   . Diabetes Mother   . Hypertension Father   . Diabetes Father     No Known Allergies  Current Outpatient Prescriptions on File Prior to Visit  Medication Sig Dispense Refill  . albuterol (PROVENTIL HFA;VENTOLIN HFA) 108 (90 Base) MCG/ACT inhaler Inhale 2 puffs into the lungs every 6 (six) hours as needed for wheezing or shortness of breath. 1 Inhaler 0  . diclofenac (VOLTAREN) 75 MG EC tablet Take 1 tablet (75 mg total) by mouth 2 (two) times daily. 30 tablet 0  . levothyroxine (SYNTHROID, LEVOTHROID) 75 MCG tablet Take 1 tablet (75 mcg total) by mouth daily. 30 tablet 3  . Vitamin D, Ergocalciferol, (DRISDOL) 50000 units CAPS capsule Take 1 capsule (50,000 Units total) by mouth every 7 (seven) days. 12 capsule 0   No current facility-administered medications on file prior to visit.    BP 110/70 mmHg  Pulse 78  Temp(Src) 98.1 F (36.7 C) (Oral)  Ht  (1.803 m)  Wt 223 lb (101.152 kg)  BMI 31.12 kg/m2  SpO2 98%       Objective:   Physical Exam  General Mental Status- Alert. General Appearance- Not in acute distress.   Skin General: Color- Normal Color. Moisture- Normal Moisture.  Neck Carotid Arteries- Normal color. Moisture- Normal Moisture. No carotid bruits. No JVD.  Chest and Lung Exam Auscultation: Breath Sounds:-Normal.  Cardiovascular Auscultation:Rythm- Regular. Murmurs & Other Heart Sounds:Auscultation of the heart reveals- No Murmurs.  Abdomen Inspection:-Inspeection Normal. Palpation/Percussion:Note:No mass. Palpation and Percussion of the abdomen reveal- Non Tender, Non Distended + BS, no rebound or guarding.    Neurologic Cranial Nerve exam:- CN III-XII intact(No nystagmus), symmetric smile. Strength:- 5/5 equal and symmetric strength both upper and lower extremities.      Assessment & Plan:

## 2015-09-24 NOTE — Addendum Note (Signed)
Addended by: Neldon Labella on: 09/24/2015 09:09 AM   Modules accepted: Orders

## 2015-09-27 ENCOUNTER — Encounter: Payer: Self-pay | Admitting: Family Medicine

## 2015-09-27 ENCOUNTER — Telehealth: Payer: Self-pay | Admitting: Medical

## 2015-09-27 ENCOUNTER — Ambulatory Visit (INDEPENDENT_AMBULATORY_CARE_PROVIDER_SITE_OTHER): Payer: PPO | Admitting: Family Medicine

## 2015-09-27 VITALS — BP 128/80 | HR 89 | Temp 98.3°F | Ht 71.0 in | Wt 223.9 lb

## 2015-09-27 DIAGNOSIS — R42 Dizziness and giddiness: Secondary | ICD-10-CM

## 2015-09-27 MED ORDER — MECLIZINE HCL 25 MG PO TABS: 25.0000 mg | ORAL_TABLET | Freq: Three times a day (TID) | ORAL | Status: DC | PRN

## 2015-09-27 NOTE — Telephone Encounter (Signed)
Pt called stating he is having dizziness. Transferred call to [facility] with [employer].

## 2015-09-27 NOTE — Telephone Encounter (Signed)
Pt kept appt as advised.

## 2015-09-27 NOTE — Progress Notes (Signed)
Pre visit review using our clinic review tool, if applicable. No additional management support is needed unless otherwise documented below in the visit note. 

## 2015-09-27 NOTE — Patient Instructions (Addendum)
-  We placed a referral for you as discussed. It usually takes about 1-2 weeks to process and schedule this referral. If you have not heard from [place] regarding this appointment in 2 weeks please contact our office.  -I sent prescription for the medication to the pharmacy  -follow up with your doctor if you have any questions or concerns in the interim

## 2015-09-27 NOTE — Telephone Encounter (Signed)
Patient Name: Timothy Austin DOB: 12-31-1985 Initial Comment Caller states dizziness Nurse Assessment Nurse: Charna Elizabeth, RN, Cathy Date/Time (Eastern Time): 09/27/2015 2:30:03 PM Confirm and document reason for call. If symptomatic, describe symptoms. You must click the next button to save text entered. ---Caller states he developed vertigo two days ago. No fever. No injury in the past 3 days. Alert and responsive. Has the patient traveled out of the country within the last 30 days? ---No Does the patient have any new or worsening symptoms? ---Yes Will a triage be completed? ---Yes Related visit to physician within the last 2 weeks? ---No Does the PT have any chronic conditions? (i.e. diabetes, asthma, etc.) ---Yes List chronic conditions. ---Vertigo Is this a behavioral health or substance abuse call? ---No Guidelines Guideline Title Affirmed Question Affirmed Notes Dizziness - Vertigo [1] MODERATE dizziness (e.g., vertigo; feels very unsteady, interferes with normal activities) AND [2] has NOT been evaluated by physician for this Final Disposition User See Physician within 24 Hours [place], RN, Lynden Ang Comments Scheduled for appointment with Dr. Selena Batten at [place] at 4:15pm today. Referrals REFERRED TO PCP OFFICE Disagree/Comply: Comply

## 2015-09-27 NOTE — Progress Notes (Signed)
HPI:  Acute visit for:  Vertigo: -reports started 9 months ago -has had 3 acute episodes of vertigo lasting several days  that resolved spontaneously  - most recent episode started 2 days ago - now resolving but still occurs with some movement such as " turning and getting out of the car" -symptoms: brief spells of spinning sensation, disequilibrium,mild nausea -meclizine was helpful in the past -reports seen in ER for first episode and seen by PCP and ENT for this in the past as well and rxd mexlizine -he is not convinced this is positional vertigo and has been doing some reading on the Internet and youtube and wants to see a neurologist to r/o other causes -he is concerned because he has felt tired for a few months and sometime has issues with focus - mainly when his wife is speaking to him -denies fevers, HA, vision changes, palpitations, SOB, weakness, numbness -he started treatment for hypothyroidism 1 week ago  ROS: See pertinent positives and negatives per HPI.  Past Medical History  Diagnosis Date  . Osteochondritis dissecans of left knee   . Wears glasses     Past Surgical History  Procedure Laterality Date  . Appendectomy  age 76  . Knee arthroscopy Left 03/06/2015    Procedure: ARTHROSCOPY KNEE DEBRIDEMENT  of OCD LESION  AND HARVEST CARTILAGE;  Surgeon: Eugenia Mcalpine, MD;  Location: [facility] [place];  Service: Orthopedics;  Laterality: Left;  . Chondroplasty Left 03/06/2015    Procedure: CHONDROPLASTY;  Surgeon: Eugenia Mcalpine, MD;  Location: [facility];  Service: Orthopedics;  Laterality: Left;    Family History  Problem Relation Age of Onset  . Hypertension Mother   . Diabetes Mother   . Hypertension Father   . Diabetes Father     Social History   Social History  . Marital Status: Married    Spouse Name: N/A  . Number of Children: N/A  . Years of Education: N/A   Social History Main Topics  . Smoking status: Never Smoker   .  Smokeless tobacco: Never Used  . Alcohol Use: No  . Drug Use: No  . Sexual Activity: Yes   Other Topics Concern  . None   Social History Narrative     Current outpatient prescriptions:  .  levothyroxine (SYNTHROID, LEVOTHROID) 75 MCG tablet, Take 1 tablet (75 mcg total) by mouth daily., Disp: 30 tablet, Rfl: 3 .  Vitamin D, Ergocalciferol, (DRISDOL) 50000 units CAPS capsule, Take 1 capsule (50,000 Units total) by mouth every 7 (seven) days., Disp: 12 capsule, Rfl: 0 .  meclizine (ANTIVERT) 25 MG tablet, Take 1 tablet (25 mg total) by mouth 3 (three) times daily as needed for dizziness., Disp: 30 tablet, Rfl: 0  EXAM:  Filed Vitals:   09/27/15 1557  BP: 128/80  Pulse: 89  Temp: 98.3 F (36.8 C)    Body mass index is 31.24 kg/(m^2).  GENERAL: vitals reviewed and listed above, alert, oriented, appears well hydrated and in no acute distress  HEENT: atraumatic, conjunttiva clear, no obvious abnormalities on inspection of external nose and ears  NECK: no obvious masses on inspection  LUNGS: clear to auscultation bilaterally, no wheezes, rales or rhonchi, good air movement  CV: HRRR, no peripheral edema  MS: moves all extremities without noticeable abnormality  PSYCH: pleasant and cooperative,no obvious depression or anxiety, CN II-XII grossly intact, finger to nose normal, dix hallpike negative  ASSESSMENT AND PLAN:  Discussed the following assessment and plan:  Vertigo -  Plan: Ambulatory referral to Neurology  -we discussed possible serious and likely etiologies, workup and treatment, treatment risks and return precautions - symptoms most likely explained by hypothyroidism recently diagnosed and started on medication 1 week ago and BPPV - I suggested trial giving the thyroid medication some time to work, vestibular rehab, meclizine and close follow up with PCP and consideration MRI/ further eval if persistent symptoms -he is adamant about see a neurologist and getting an  Rx for meclizine instead -of course, we advised [place]  to return or notify a doctor immediately if symptoms worsen or persist or new concerns arise.  -Patient advised to return or notify a doctor immediately if symptoms worsen or persist or new concerns arise.  Patient Instructions  -We placed a referral for you as discussed. It usually takes about 1-2 weeks to process and schedule this referral. If you have not heard from [place] regarding this appointment in 2 weeks please contact our office.  -I sent prescription for the medication to the pharmacy  -follow up with your doctor if you have any questions or concerns in the interim     Mykell Genao, Dahlia Client R.

## 2015-10-19 ENCOUNTER — Other Ambulatory Visit: Payer: PPO

## 2015-10-24 ENCOUNTER — Other Ambulatory Visit: Payer: PPO

## 2015-10-31 ENCOUNTER — Encounter: Payer: Self-pay | Admitting: Neurology

## 2015-10-31 ENCOUNTER — Ambulatory Visit (INDEPENDENT_AMBULATORY_CARE_PROVIDER_SITE_OTHER): Payer: PPO | Admitting: Neurology

## 2015-10-31 VITALS — BP 116/70 | HR 78 | Ht 71.0 in | Wt 223.0 lb

## 2015-10-31 DIAGNOSIS — R410 Disorientation, unspecified: Secondary | ICD-10-CM

## 2015-10-31 DIAGNOSIS — R42 Dizziness and giddiness: Secondary | ICD-10-CM | POA: Diagnosis not present

## 2015-10-31 NOTE — Progress Notes (Signed)
NEUROLOGY CONSULTATION NOTE  Timothy Austin MRN: [id number] DOB: October 06, 1985  Referring provider: Kriste BasqueHannah Kim, DO Primary care provider: Esperanza RichtersEdward Saguier, PA-C  Reason for consult:  dizziness  HISTORY OF PRESENT ILLNESS: Timothy Austin is a 30 year old right-handed male who presents for vertigo.  History obtained by patient, ENT and PCP note.  Labs reviewed.  For the past 9 months, he has experienced dizzy spells on and off.  It is a sensation of confusion or feeling like he has been sleep-deprived.  There is no spinning sensation, lightheadedness or feeling of passing out.  He reports blurred vision.  He also reports otalgia in the left ear.  There is no associated headache, nausea or ataxia.  It often will last a couple of hours up to a day or two.  He was evaluated by ENT.  Physical exam and VNG was normal. He has no prior history of migraines.  His mother has similar spells of dizziness.  Recent labs include B12 279, normal CBC, CMP with mildly elevated AST (38) and ALT (72), and elevated TSH of 6.50.  T4 was 7.7.  He is on Synthroid.  Vitamin D level was low at 13.90 and he was started on supplementation.  PAST MEDICAL HISTORY: Past Medical History  Diagnosis Date  . Osteochondritis dissecans of left knee   . Wears glasses     PAST SURGICAL HISTORY: Past Surgical History  Procedure Laterality Date  . Appendectomy  age 30  . Knee arthroscopy Left 03/06/2015    Procedure: ARTHROSCOPY KNEE DEBRIDEMENT  of OCD LESION  AND HARVEST CARTILAGE;  Surgeon: Eugenia Mcalpineobert Collins, MD;  [facility];  Service: Orthopedics;  Laterality: Left;  . Chondroplasty Left 03/06/2015    Procedure: CHONDROPLASTY;  Surgeon: Eugenia Mcalpineobert Collins, MD;  [facility] [place];  Service: Orthopedics;  Laterality: Left;    MEDICATIONS: Current Outpatient Prescriptions on File Prior to Visit  Medication Sig Dispense Refill  . levothyroxine (SYNTHROID, LEVOTHROID) 75 MCG tablet  Take 1 tablet (75 mcg total) by mouth daily. 30 tablet 3  . Vitamin D, Ergocalciferol, (DRISDOL) 50000 units CAPS capsule Take 1 capsule (50,000 Units total) by mouth every 7 (seven) days. 12 capsule 0   No current facility-administered medications on file prior to visit.    ALLERGIES: No Known Allergies  FAMILY HISTORY: Family History  Problem Relation Age of Onset  . Hypertension Mother   . Diabetes Mother   . Hypertension Father   . Diabetes Father     SOCIAL HISTORY: Social History   Social History  . Marital Status: Married    Spouse Name: N/A  . Number of Children: N/A  . Years of Education: N/A   Occupational History  . Not on file.   Social History Main Topics  . Smoking status: Never Smoker   . Smokeless tobacco: Never Used  . Alcohol Use: No  . Drug Use: No  . Sexual Activity: Yes   Other Topics Concern  . Not on file   Social History Narrative    REVIEW OF SYSTEMS: Constitutional: No fevers, chills, or sweats, no generalized fatigue, change in appetite Eyes: No visual changes, double vision, eye pain Ear, nose and throat: No hearing loss, ear pain, nasal congestion, sore throat Cardiovascular: No chest pain, palpitations Respiratory:  No shortness of breath at rest or with exertion, wheezes GastrointestinaI: No nausea, vomiting, diarrhea, abdominal pain, fecal incontinence Genitourinary:  No dysuria, urinary retention or frequency Musculoskeletal:  No neck pain, back  pain Integumentary: No rash, pruritus, skin lesions Neurological: as above Psychiatric: No depression, insomnia, anxiety Endocrine: No palpitations, fatigue, diaphoresis, mood swings, change in appetite, change in weight, increased thirst Hematologic/Lymphatic:  No anemia, purpura, petechiae. Allergic/Immunologic: no itchy/runny eyes, nasal congestion, recent allergic reactions, rashes  PHYSICAL EXAM: Filed Vitals:   10/31/15 1502  BP: 116/70  Pulse: 78   General: No acute  distress.  Patient appears well-groomed.  Head:  Normocephalic/atraumatic Eyes:  fundi unremarkable, without vessel changes, exudates, hemorrhages or papilledema. Neck: supple, no paraspinal tenderness, full range of motion Back: No paraspinal tenderness Heart: regular rate and rhythm Lungs: Clear to auscultation bilaterally. Vascular: No carotid bruits. Neurological Exam: Mental status: alert and oriented to person, place, and time, recent and remote memory intact, fund of knowledge intact, attention and concentration intact, speech fluent and not dysarthric, language intact. Cranial nerves: CN I: not tested CN II: pupils equal, round and reactive to light, visual fields intact, fundi unremarkable, without vessel changes, exudates, hemorrhages or papilledema. CN III, IV, VI:  full range of motion, no nystagmus, no ptosis CN V: facial sensation intact CN VII: upper and lower face symmetric CN VIII: hearing intact CN IX, X: gag intact, uvula midline CN XI: sternocleidomastoid and trapezius muscles intact CN XII: tongue midline Bulk & Tone: normal, no fasciculations. Motor:  5/5 throughout  Sensation: temperature and vibration sensation intact. Deep Tendon Reflexes:  2+ throughout, toes downgoing. Finger to nose testing:  Without dysmetria.  Heel to shin:  Without dysmetria.  Gait:  Normal station and stride.  Able to turn and tandem walk. Romberg negative.  IMPRESSION: Dizziness.  Symptoms are vague but he does not describe vertigo.  It does not sound like migraine either.  PLAN: Will get MRI of brain with and without contrast Further recommendations pending results.  If MRI unremarkable, I really have no explanation for symptoms His B12 is in the low-normal range.  Therefore, recommend starting 1000 mcg po daily and recheck in 6 months with PCP.  Thank you for allowing me to take part in the care of this patient.  Shon Millet, DO  CC:  Esperanza Richters, PA-C  Kriste Basque,  DO

## 2015-10-31 NOTE — Patient Instructions (Signed)
1.  We will get MRI of the brain with and without contrast.   2.  Further recommendations pending results.

## 2015-11-01 ENCOUNTER — Telehealth: Payer: Self-pay

## 2015-11-01 NOTE — Telephone Encounter (Signed)
His B12 is in the low-normal range. Therefore, recommend starting 1000 mcg po daily and recheck in 6 months with PCP. - From A&P From [facility] on 10/31/15. Called and left vm for pt.

## 2015-11-07 ENCOUNTER — Other Ambulatory Visit: Payer: PPO

## 2015-12-01 ENCOUNTER — Other Ambulatory Visit: Payer: Self-pay | Admitting: Medical

## 2015-12-31 ENCOUNTER — Other Ambulatory Visit: Payer: Self-pay | Admitting: Medical

## 2016-01-02 ENCOUNTER — Encounter: Payer: Self-pay | Admitting: Medical

## 2016-01-02 ENCOUNTER — Telehealth: Payer: Self-pay | Admitting: Medical

## 2016-01-02 ENCOUNTER — Ambulatory Visit (INDEPENDENT_AMBULATORY_CARE_PROVIDER_SITE_OTHER): Payer: PPO | Admitting: Medical

## 2016-01-02 VITALS — BP 118/80 | HR 77 | Temp 97.7°F | Ht 71.0 in | Wt 222.0 lb

## 2016-01-02 DIAGNOSIS — Z8349 Family history of other endocrine, nutritional and metabolic diseases: Secondary | ICD-10-CM | POA: Diagnosis not present

## 2016-01-02 DIAGNOSIS — R7989 Other specified abnormal findings of blood chemistry: Secondary | ICD-10-CM | POA: Diagnosis not present

## 2016-01-02 DIAGNOSIS — R42 Dizziness and giddiness: Secondary | ICD-10-CM

## 2016-01-02 DIAGNOSIS — Z83438 Family history of other disorder of lipoprotein metabolism and other lipidemia: Secondary | ICD-10-CM

## 2016-01-02 DIAGNOSIS — H538 Other visual disturbances: Secondary | ICD-10-CM

## 2016-01-02 DIAGNOSIS — E669 Obesity, unspecified: Secondary | ICD-10-CM

## 2016-01-02 DIAGNOSIS — E039 Hypothyroidism, unspecified: Secondary | ICD-10-CM | POA: Diagnosis not present

## 2016-01-02 LAB — LIPID PANEL
Cholesterol: 189 mg/dL (ref 0–200)
HDL: 27.7 mg/dL — AB (ref >39.00)
LDL CALC: 151 mg/dL — AB (ref 0–99)
NONHDL: 161.78
Total CHOL/HDL Ratio: 7
Triglycerides: 54 mg/dL (ref 0.0–149.0)
VLDL: 10.8 mg/dL (ref 0.0–40.0)

## 2016-01-02 LAB — VITAMIN D 25 HYDROXY (VIT D DEFICIENCY, FRACTURES): VITD: 39.24 ng/mL (ref 30.00–100.00)

## 2016-01-02 LAB — TSH: TSH: 5.54 u[IU]/mL — ABNORMAL HIGH (ref 0.35–4.50)

## 2016-01-02 NOTE — Telephone Encounter (Signed)
Neurologist ordered mri in the past for pt. Is it still active. Where would he go for the test. Will you investigate and call pt.

## 2016-01-02 NOTE — Progress Notes (Signed)
Subjective:    Patient ID: Timothy Austin, male    DOB: [birth year], 30 y.o.   MRN: [id number]  HPI  Pt in for follow up. Pt has seen neurologist for dizziness and reported confusion. Neurologist could not find cause. It was recommended he get MRI based on his symptoms but pt did not get. Pt states scheduling issue.  Pt has seen ENT in the past and evaluated. Special testing done but he can't remember exactly what test were done. Only given meclizine. He also suggested maybe some bp meds per pt report.  Pt mentioned when he sleeps he will feel better.  Pt still states dizziness is still coming and going(at time is brief but other times could last up to 24 hours) Most of time brief. Today he feels ok. With dizziness does not report any gross motor or sensory function deficits.  Pt has also seen eye doctor about a year ago. But he still has concern. He note sometimes when he removes glasses will feel dizzy.  Also notes occasional blurred vision with dizzinnes. Sometimes when closes eyes dizziness is resolved.    Review of Systems  Constitutional: Negative for fever, chills, diaphoresis, activity change and fatigue.  Respiratory: Negative for cough, chest tightness and shortness of breath.   Cardiovascular: Negative for chest pain, palpitations and leg swelling.  Gastrointestinal: Negative for nausea, vomiting and abdominal pain.  Musculoskeletal: Negative for neck pain and neck stiffness.  Neurological: Positive for dizziness. Negative for tremors, seizures, syncope, facial asymmetry, speech difficulty, weakness, light-headedness, numbness and headaches.  Psychiatric/Behavioral: Negative for behavioral problems, confusion and agitation. The patient is not nervous/anxious.     Past Medical History  Diagnosis Date  . Osteochondritis dissecans of left knee   . Wears glasses      Social History   Social History  . Marital Status: Married    Spouse Name: N/A  . Number of Children:  N/A  . Years of Education: N/A   Occupational History  . Not on file.   Social History Main Topics  . Smoking status: Never Smoker   . Smokeless tobacco: Never Used  . Alcohol Use: No  . Drug Use: No  . Sexual Activity: Yes   Other Topics Concern  . Not on file   Social History Narrative    Past Surgical History  Procedure Laterality Date  . Appendectomy  age 79  . Knee arthroscopy Left 03/06/2015    Procedure: ARTHROSCOPY KNEE DEBRIDEMENT  of OCD LESION  AND HARVEST CARTILAGE;  Surgeon: Eugenia Mcalpine, MD;  Location: [facility] ;  Service: Orthopedics;  Laterality: Left;  . Chondroplasty Left 03/06/2015    Procedure: CHONDROPLASTY;  Surgeon: Eugenia Mcalpine, MD;  Location: [facility];  Service: Orthopedics;  Laterality: Left;    Family History  Problem Relation Age of Onset  . Hypertension Mother   . Diabetes Mother   . Hypertension Father   . Diabetes Father     No Known Allergies  Current Outpatient Prescriptions on File Prior to Visit  Medication Sig Dispense Refill  . levothyroxine (SYNTHROID, LEVOTHROID) 75 MCG tablet TAKE 1 TABLET (75 MCG TOTAL) BY MOUTH DAILY. 30 tablet 3  . Vitamin D, Ergocalciferol, (DRISDOL) 50000 units CAPS capsule TAKE 1 CAPSULE (50,000 UNITS TOTAL) BY MOUTH EVERY 7 (SEVEN) DAYS. 12 capsule 0   No current facility-administered medications on file prior to visit.    BP 118/80 mmHg  Pulse 77  Temp(Src) 97.7 F (36.5 C) (Oral)  Ht 5\' 11"  (1.803 m)  Wt 222 lb (100.699 kg)  BMI 30.98 kg/m2  SpO2 98%       Objective:   Physical Exam  General Mental Status- Alert. General Appearance- Not in acute distress.   Skin General: Color- Normal Color. Moisture- Normal Moisture.  Neck Carotid Arteries- Normal color. Moisture- Normal Moisture. No carotid bruits. No JVD.  Chest and Lung Exam Auscultation: Breath Sounds:-Normal.  Cardiovascular Auscultation:Rythm- Regular. Murmurs & Other Heart  Sounds:Auscultation of the heart reveals- No Murmurs.  Abdomen Inspection:-Inspeection Normal. Palpation/Percussion:Note:No mass. Palpation and Percussion of the abdomen reveal- Non Tender, Non Distended + BS, no rebound or guarding.    Neurologic Cranial Nerve exam:- CN III-XII intact(No nystagmus), symmetric smile. Strength:- 5/5 equal and symmetric strength both upper and lower extremities.      Assessment & Plan:  For your dizziness, I would get MRI that the neurologist ordered.   The ent has recommended meclizine and you could use that occasionally.  I will go ahead and refer you to optometrist since you associate dizziness with blurred vision.  For history of low thyroid will get tsh today.  Also for low vitamin D will repeat.  Follow up in 3 wks or as needed  Did get lipid panel at pt request. Dx obesity and family hx of hyperlipidemia.  Avner Stroder, Ramon DredgeEdward, PA-C

## 2016-01-02 NOTE — Patient Instructions (Addendum)
For your dizziness, I would get MRI that the neurologist ordered. Our staff will see if still active order.  The ent has recommended meclizine and you could use that occasionally.  I will go ahead and refer you to optometrist since you associate dizziness with blurred vision.  For history of low thyroid will get tsh today.  Also for low vitamin D will repeat.  Follow up in 3 wks or as needed

## 2016-01-02 NOTE — Progress Notes (Signed)
Pre visit review using our clinic review tool, if applicable. No additional management support is needed unless otherwise documented below in the visit note. 

## 2016-01-03 MED ORDER — LEVOTHYROXINE SODIUM 100 MCG PO TABS: 100.0000 ug | ORAL_TABLET | Freq: Every day | ORAL | Status: DC

## 2016-01-03 NOTE — Telephone Encounter (Signed)
Sent in higher dose of thyroid med.

## 2016-01-03 NOTE — Telephone Encounter (Signed)
Left message for pt to call back for lab results.

## 2016-01-08 NOTE — Telephone Encounter (Signed)
Letter mailed to pt 01/08/16.

## 2016-01-10 ENCOUNTER — Telehealth: Payer: Self-pay | Admitting: Medical

## 2016-01-10 NOTE — Telephone Encounter (Signed)
Caller name: Self  Can be reached: [phone]  Pharmacy:  [id number] [place] - [address] [phone] (Phone) [phone] (Fax)       Reason for call:Request enough Synthroid to last him until the end of August because he is traveling out of the country and will not be back. States he will run out of meds before he returns

## 2016-01-11 MED ORDER — LEVOTHYROXINE SODIUM 100 MCG PO TABS: 100.0000 ug | ORAL_TABLET | Freq: Every day | ORAL | Status: DC

## 2016-01-11 NOTE — Telephone Encounter (Signed)
Pt picked up synthroid yesterday from [facility] for 30 day supply. He is going out of the country and needs to have 90 day supply or he'll run out. Can you please resend the RX and see if pharmacy will dispense the additional 60 day supply to pt?

## 2016-01-11 NOTE — Telephone Encounter (Signed)
Spoke with pt and he was advised that I could send in Rx and that he would have to come back for repeat lab work when he was back in the country. He voices understanding.   Spoke with pharmacy and they will cancel the previous order that was in the system. The pt will have to call back for refills.

## 2016-03-07 ENCOUNTER — Other Ambulatory Visit: Payer: Self-pay | Admitting: Medical

## 2016-04-27 ENCOUNTER — Encounter (HOSPITAL_BASED_OUTPATIENT_CLINIC_OR_DEPARTMENT_OTHER): Payer: Self-pay | Admitting: *Deleted

## 2016-04-27 ENCOUNTER — Emergency Department (HOSPITAL_BASED_OUTPATIENT_CLINIC_OR_DEPARTMENT_OTHER)
Admission: EM | Admit: 2016-04-27 | Discharge: 2016-04-27 | Disposition: A | Discharge status: Home / Self Care | Payer: PPO | Attending: Physician Assistant | Admitting: Physician Assistant

## 2016-04-27 ENCOUNTER — Emergency Department (HOSPITAL_BASED_OUTPATIENT_CLINIC_OR_DEPARTMENT_OTHER): Payer: PPO

## 2016-04-27 DIAGNOSIS — R0989 Other specified symptoms and signs involving the circulatory and respiratory systems: Secondary | ICD-10-CM

## 2016-04-27 DIAGNOSIS — R093 Abnormal sputum: Secondary | ICD-10-CM | POA: Diagnosis not present

## 2016-04-27 DIAGNOSIS — R040 Epistaxis: Secondary | ICD-10-CM | POA: Diagnosis present

## 2016-04-27 MED ORDER — OXYMETAZOLINE HCL 0.05 % NA SOLN
1.0000 | Freq: Every day | NASAL | Status: DC | PRN
  Administered 2016-04-27: 1 via NASAL
  Filled 2016-04-27: qty 15

## 2016-04-27 NOTE — ED Notes (Signed)
Pt given d/c instructions as per chart. Verbalizes understanding. No questions. 

## 2016-04-27 NOTE — ED Triage Notes (Signed)
Pt states at different times over the past year, he has noticed blood when he blows his nose and when he spits. Was ? Exposed to TB about 6 months ago, but was tested and it was negative. Denies other s/s.

## 2016-04-27 NOTE — Discharge Instructions (Signed)
We think the blood in your phlegm is likely just irritation of her nasal passages. Please use this nasal spray to help. Also please follow-up with her primary care physician. If you  have any cough, blood in your cough, fever please return immediately.

## 2016-04-27 NOTE — ED Provider Notes (Signed)
[facility] Provider Note   CSN: [id number] Arrival date & time: 04/27/16  1611  By signing my name below, I, Phillis HaggisGabriella Austin, attest that this documentation has been prepared under the direction and in the presence of [name] [name], MD. Electronically Signed: Phillis HaggisGabriella Austin, ED Scribe. 04/27/16. 6:28 PM.  History   Chief Complaint Chief Complaint  Patient presents with  . Epistaxis   The history is provided by the patient. No language interpreter was used.   HPI Comments: Timothy Austin is a 30 y.o. male who presents to the Emergency Department complaining of occasional blood tinged sputum over the past year, worsening over the past 3 days. Pt states that he will noticed blood when he blows his nose or spits out sputum. Pt reports that he sees the blood typically in the morning. It has been lighter over the past 2 days, but noticed a clot today. Pt states that he has been blowing his nose a lot. He was possibly exposed to TB about 6 months ago, but his test results came back negative. He denies rhinorrhea, epistaxis, cough, lightheadedness or dizziness.  PCP: Marisue BrooklynSaguier, Edward, PA-C  Past Medical History:  Diagnosis Date  . Osteochondritis dissecans of left knee   . Wears glasses     Patient Active Problem List   Diagnosis Date Noted  . Wellness examination 09/21/2015  . S/P left knee arthroscopy 03/06/2015    Past Surgical History:  Procedure Laterality Date  . APPENDECTOMY  age 30  . CHONDROPLASTY Left 03/06/2015   Procedure: CHONDROPLASTY;  Surgeon: Eugenia Mcalpineobert Collins, MD;  [facility] [place];  Service: Orthopedics;  Laterality: Left;  . KNEE ARTHROSCOPY Left 03/06/2015   Procedure: ARTHROSCOPY KNEE DEBRIDEMENT  of OCD LESION  AND HARVEST CARTILAGE;  Surgeon: Eugenia Mcalpineobert Collins, MD;  [facility] [place];  Service: Orthopedics;  Laterality: Left;    Home Medications    Prior to Admission medications   Medication Sig Start Date  End Date Taking? Authorizing Provider  levothyroxine (SYNTHROID, LEVOTHROID) 100 MCG tablet Take 1 tablet (100 mcg total) by mouth daily. 01/11/16   Ramon DredgeEdward Saguier, PA-C  levothyroxine (SYNTHROID, LEVOTHROID) 100 MCG tablet TAKE 1 TABLET (100 MCG TOTAL) BY MOUTH DAILY. 03/07/16   Ramon DredgeEdward Saguier, PA-C  Vitamin D, Ergocalciferol, (DRISDOL) 50000 units CAPS capsule TAKE 1 CAPSULE (50,000 UNITS TOTAL) BY MOUTH EVERY 7 (SEVEN) DAYS. 12/03/15   Esperanza RichtersEdward Saguier, PA-C    Family History Family History  Problem Relation Age of Onset  . Hypertension Mother   . Diabetes Mother   . Hypertension Father   . Diabetes Father     Social History Social History  Substance Use Topics  . Smoking status: Never Smoker  . Smokeless tobacco: Never Used  . Alcohol use No     Allergies   Review of patient's allergies indicates no known allergies.   Review of Systems Review of Systems  HENT: Negative for nosebleeds, rhinorrhea and sore throat.        Bloody sputum  Respiratory: Negative for cough.   Neurological: Negative for dizziness and light-headedness.  All other systems reviewed and are negative.    Physical Exam Updated Vital Signs BP 130/88 (BP Location: Right Arm)   Pulse 76   Temp 98.8 F (37.1 C) (Oral)   Resp 18   Ht 5' 9.5" (1.765 m)   Wt 225 lb (102.1 kg)   SpO2 100%   BMI 32.75 kg/m   Physical Exam  Constitutional: He is oriented  to person, place, and time. He appears well-developed and well-nourished.  HENT:  Head: Normocephalic and atraumatic.  Right Ear: Tympanic membrane normal.  Left Ear: Tympanic membrane normal.  Mouth/Throat: Uvula is midline, oropharynx is clear and moist and mucous membranes are normal.  Mild erythema to bilateral nares and bilateral ear canals  Eyes: EOM are normal. Pupils are equal, round, and reactive to light.  Neck: Normal range of motion. Neck supple.  Cardiovascular: Normal rate, regular rhythm and normal heart sounds.  Exam reveals no  gallop and no friction rub.   No murmur heard. Pulmonary/Chest: Effort normal and breath sounds normal. He has no wheezes.  Abdominal: Soft. There is no tenderness.  Musculoskeletal: Normal range of motion.  Neurological: He is alert and oriented to person, place, and time.  Skin: Skin is warm and dry.  Psychiatric: He has a normal mood and affect. His behavior is normal.  Nursing note and vitals reviewed.  ED Treatments / Results  DIAGNOSTIC STUDIES: Oxygen Saturation is 100% on RA, normal by my interpretation.    COORDINATION OF CARE: 6:28 PM-Discussed treatment plan which includes x-ray and nasal spray with pt at bedside and pt agreed to plan.    Labs (all labs ordered are listed, but only abnormal results are displayed) Labs Reviewed - No data to display  EKG  EKG Interpretation None       Radiology Dg Chest 2 View  Result Date: 04/27/2016 CLINICAL DATA:  Blood in sputum for 3 days.  TB exposure. EXAM: CHEST  2 VIEW COMPARISON:  September 11, 2015 FINDINGS: The heart size and mediastinal contours are within normal limits. Both lungs are clear. The visualized skeletal structures are unremarkable. IMPRESSION: No active cardiopulmonary disease. Electronically Signed   By: Timothy Austin M.D   On: 04/27/2016 17:06    Procedures Procedures (including critical care time)  Medications Ordered in ED Medications - No data to display   Initial Impression / Assessment and Plan / ED Course  I have reviewed the triage vital signs and the nursing notes.  Pertinent labs & imaging results that were available during my care of the patient were reviewed by me and considered in my medical decision making (see chart for details).  Clinical Course    Patient is a 30 year old-year-old male presenting with blood in his phlegm. Patient when he clears his nose and throat and develops a bit of phlegm spits out and had pieces of blood in it. No cough. Mild erythematous nasal turbinates.  I think this likely from irritated nasal passages. We'll give him Afrin to help. Normal vital signs. It has bits of streaks of blood and in his phlegm not sputum.  Patient appears very well. No fevers. Apparently he was exposed to TB at some point but has had negative testing. I'm not concerned about TB or alveolar hemmorhage at this time. Will hav ehim follow up wthi pcp.   Final Clinical Impressions(s) / ED Diagnoses   Final diagnoses:  None  . .scribe I personally performed the services described in this documentation, which was scribed in my presence. The recorded information has been reviewed and is accurate.      New Prescriptions New Prescriptions   No medications on file     Santiana Glidden Randall An, MD 04/27/16 1911

## 2016-06-05 ENCOUNTER — Telehealth: Payer: Self-pay | Admitting: Medical

## 2016-06-05 ENCOUNTER — Encounter: Payer: Self-pay | Admitting: Medical

## 2016-06-05 ENCOUNTER — Ambulatory Visit (INDEPENDENT_AMBULATORY_CARE_PROVIDER_SITE_OTHER): Payer: PPO | Admitting: Medical

## 2016-06-05 VITALS — BP 110/78 | HR 77 | Temp 98.1°F | Ht 71.0 in | Wt 220.8 lb

## 2016-06-05 DIAGNOSIS — Z8619 Personal history of other infectious and parasitic diseases: Secondary | ICD-10-CM

## 2016-06-05 DIAGNOSIS — R7989 Other specified abnormal findings of blood chemistry: Secondary | ICD-10-CM

## 2016-06-05 DIAGNOSIS — E039 Hypothyroidism, unspecified: Secondary | ICD-10-CM

## 2016-06-05 DIAGNOSIS — R5383 Other fatigue: Secondary | ICD-10-CM | POA: Diagnosis not present

## 2016-06-05 DIAGNOSIS — E785 Hyperlipidemia, unspecified: Secondary | ICD-10-CM

## 2016-06-05 DIAGNOSIS — H9202 Otalgia, left ear: Secondary | ICD-10-CM | POA: Diagnosis not present

## 2016-06-05 LAB — VITAMIN D 25 HYDROXY (VIT D DEFICIENCY, FRACTURES): VITD: 15.31 ng/mL — ABNORMAL LOW (ref 30.00–100.00)

## 2016-06-05 LAB — CBC WITH DIFFERENTIAL/PLATELET
BASOS ABS: 0 10*3/uL (ref 0.0–0.1)
Basophils Relative: 0.5 % (ref 0.0–3.0)
EOS ABS: 0.1 10*3/uL (ref 0.0–0.7)
Eosinophils Relative: 2.4 % (ref 0.0–5.0)
HCT: 42.6 % (ref 39.0–52.0)
Hemoglobin: 14.6 g/dL (ref 13.0–17.0)
LYMPHS ABS: 2.8 10*3/uL (ref 0.7–4.0)
Lymphocytes Relative: 44.7 % (ref 12.0–46.0)
MCHC: 34.3 g/dL (ref 30.0–36.0)
MCV: 85.2 fl (ref 78.0–100.0)
MONO ABS: 0.5 10*3/uL (ref 0.1–1.0)
MONOS PCT: 7.2 % (ref 3.0–12.0)
NEUTROS ABS: 2.8 10*3/uL (ref 1.4–7.7)
NEUTROS PCT: 45.2 % (ref 43.0–77.0)
PLATELETS: 186 10*3/uL (ref 150.0–400.0)
RBC: 4.99 Mil/uL (ref 4.22–5.81)
RDW: 13.1 % (ref 11.5–15.5)
WBC: 6.3 10*3/uL (ref 4.0–10.5)

## 2016-06-05 LAB — T4, FREE: Free T4: 0.78 ng/dL (ref 0.60–1.60)

## 2016-06-05 LAB — COMPREHENSIVE METABOLIC PANEL
ALBUMIN: 4.6 g/dL (ref 3.5–5.2)
ALT: 57 U/L — ABNORMAL HIGH (ref 0–53)
AST: 32 U/L (ref 0–37)
Alkaline Phosphatase: 57 U/L (ref 39–117)
BUN: 14 mg/dL (ref 6–23)
CHLORIDE: 104 meq/L (ref 96–112)
CO2: 28 meq/L (ref 19–32)
Calcium: 9.7 mg/dL (ref 8.4–10.5)
Creatinine, Ser: 0.87 mg/dL (ref 0.40–1.50)
GFR: 108.86 mL/min (ref >60.00)
Glucose, Bld: 92 mg/dL (ref 70–99)
POTASSIUM: 3.8 meq/L (ref 3.5–5.1)
SODIUM: 139 meq/L (ref 135–145)
Total Bilirubin: 0.8 mg/dL (ref 0.2–1.2)
Total Protein: 8.1 g/dL (ref 6.0–8.3)

## 2016-06-05 LAB — LIPID PANEL
CHOL/HDL RATIO: 7
Cholesterol: 205 mg/dL — ABNORMAL HIGH (ref 0–200)
HDL: 29.7 mg/dL — AB (ref >39.00)
LDL CALC: 154 mg/dL — AB (ref 0–99)
NONHDL: 174.85
TRIGLYCERIDES: 104 mg/dL (ref 0.0–149.0)
VLDL: 20.8 mg/dL (ref 0.0–40.0)

## 2016-06-05 LAB — TSH: TSH: 3 u[IU]/mL (ref 0.35–4.50)

## 2016-06-05 LAB — VITAMIN B12: VITAMIN B 12: 284 pg/mL (ref 211–911)

## 2016-06-05 LAB — T3: T3, Total: 108 ng/dL (ref 76–181)

## 2016-06-05 MED ORDER — ATORVASTATIN CALCIUM 10 MG PO TABS: 10.0000 mg | ORAL_TABLET | Freq: Every day | ORAL | 0 refills | Status: DC

## 2016-06-05 MED ORDER — AMOXICILLIN-POT CLAVULANATE 875-125 MG PO TABS: 1.0000 | ORAL_TABLET | Freq: Two times a day (BID) | ORAL | 0 refills | Status: DC

## 2016-06-05 MED ORDER — LEVOTHYROXINE SODIUM 100 MCG PO TABS: 100.0000 ug | ORAL_TABLET | Freq: Every day | ORAL | 1 refills | Status: DC

## 2016-06-05 MED ORDER — FLUTICASONE PROPIONATE 50 MCG/ACT NA SUSP: 2.0000 | Freq: Every day | NASAL | 1 refills | Status: DC

## 2016-06-05 MED ORDER — VITAMIN D (ERGOCALCIFEROL) 1.25 MG (50000 UNIT) PO CAPS: 50000.0000 [IU] | ORAL_CAPSULE | ORAL | 0 refills | Status: DC

## 2016-06-05 MED ORDER — LEVOTHYROXINE SODIUM 100 MCG PO TABS: 100.0000 ug | ORAL_TABLET | Freq: Every day | ORAL | 0 refills | Status: DC

## 2016-06-05 NOTE — Progress Notes (Signed)
Pre visit review using our clinic review tool, if applicable. No additional management support is needed unless otherwise documented below in the visit note./hsm  

## 2016-06-05 NOTE — Telephone Encounter (Signed)
Will you call Dr. Everlena CooperJaffe office or xray. He had mri ordered by Dr. Everlena CooperJaffe and was never called to schedule. So is order active. Maybe his office will coordinate with pt. Or radiology will call??

## 2016-06-05 NOTE — Progress Notes (Signed)
Subjective:    Patient ID: Timothy Austin, male    DOB: Dec 21, 1985, 30 y.o.   MRN: [id number]  HPI  Pt in for follow up.  Pt states he wants to check on his thyroid level. Pt ran out of medication 2 days ago. Did not have medication since  yesterday.  Pt has hx of increased was ldl  151 and good cholesterol was mild low. Pt is fasting.  Pt vitamin d was low in past. 5 months ago level came back up after rx vitamin D. He wants level checked again.  Pt b-12 level in January was normal in past.  Pt does report some mild fatigue.    Left ear-Pt also states his left ear has been sensitive with pain with curtain tones. His son crying will cause some mild pain. Also ring tones of cell will hurt. High pitched sounds will hurt his ear. This has been going on for 1 yr or more.   Review of Systems  Constitutional: Positive for fatigue. Negative for chills, diaphoresis and fever.  HENT: Positive for ear pain. Negative for congestion, hearing loss, nosebleeds, postnasal drip, sinus pressure and sore throat.   Respiratory: Negative for cough, chest tightness, shortness of breath and wheezing.   Cardiovascular: Negative for chest pain and palpitations.  Gastrointestinal: Negative for abdominal pain.  Genitourinary: Negative for dysuria and flank pain.  Musculoskeletal: Negative for arthralgias, back pain, myalgias, neck pain and neck stiffness.  Skin: Negative for rash.  Neurological: Negative for dizziness, syncope, weakness, numbness and headaches.       Hx of dizziness. Seen by Dr. Everlena Cooper in past. Pt never got mri in the past.  Hematological: Negative for adenopathy. Does not bruise/bleed easily.  Psychiatric/Behavioral: Negative for behavioral problems and confusion.    Past Medical History:  Diagnosis Date  . Osteochondritis dissecans of left knee   . Wears glasses      Social History   Social History  . Marital status: Married    Spouse name: N/A  . Number of children: N/A    . Years of education: N/A   Occupational History  . Not on file.   Social History Main Topics  . Smoking status: Never Smoker  . Smokeless tobacco: Never Used  . Alcohol use No  . Drug use: No  . Sexual activity: Yes   Other Topics Concern  . Not on file   Social History Narrative  . No narrative on file    Past Surgical History:  Procedure Laterality Date  . APPENDECTOMY  age 38  . CHONDROPLASTY Left 03/06/2015   Procedure: CHONDROPLASTY;  Surgeon: Eugenia Mcalpine, MD;  Location: [facility];  Service: Orthopedics;  Laterality: Left;  . KNEE ARTHROSCOPY Left 03/06/2015   Procedure: ARTHROSCOPY KNEE DEBRIDEMENT  of OCD LESION  AND HARVEST CARTILAGE;  Surgeon: Eugenia Mcalpine, MD;  Location: [facility];  Service: Orthopedics;  Laterality: Left;    Family History  Problem Relation Age of Onset  . Hypertension Mother   . Diabetes Mother   . Hypertension Father   . Diabetes Father     No Known Allergies  Current Outpatient Prescriptions on File Prior to Visit  Medication Sig Dispense Refill  . levothyroxine (SYNTHROID, LEVOTHROID) 100 MCG tablet Take 1 tablet (100 mcg total) by mouth daily. 60 tablet 0   No current facility-administered medications on file prior to visit.     BP 110/78 (BP Location: Left Arm, Patient Position: Sitting, Cuff Size: Normal)  Pulse 77   Temp 98.1 F (36.7 C) (Oral)   Ht 5\' 11"  (1.803 m)   Wt 220 lb 12.8 oz (100.2 kg)   SpO2 97%   BMI 30.80 kg/m       Objective:   Physical Exam   General  Mental Status - Alert. General Appearance - Well groomed. Not in acute distress.  Skin Rashes- No Rashes.  HEENT Head- Normal. Ear Auditory Canal - Left- Normal. Right - Normal.Tympanic Membrane- Left- mild- moderate red. Right- moderate red tm. Eye Sclera/Conjunctiva- Left- Normal. Right- Normal. Nose & Sinuses Nasal Mucosa- Left-  Boggy and Congested. Right-  Boggy and  Congested.Bilateral no maxillary  and no frontal sinus pressure. Mouth & Throat Lips: Upper Lip- Normal: no dryness, cracking, pallor, cyanosis, or vesicular eruption. Lower Lip-Normal: no dryness, cracking, pallor, cyanosis or vesicular eruption. Buccal Mucosa- Bilateral- No Aphthous ulcers. Oropharynx- No Discharge or Erythema. Tonsils: Characteristics- Bilateral- No Erythema or Congestion. Size/Enlargement- Bilateral- No enlargement. Discharge- bilateral-None.  Neck Neck- Supple. No Masses.   Chest and Lung Exam Auscultation: Breath Sounds:-even and unlabored.  Cardiovascular Auscultation:Rythm- Regular, rate and rhythm. Murmurs & Other Heart Sounds:Ausculatation of the heart reveal- No Murmurs.  Lymphatic Head & Neck General Head & Neck Lymphatics: Bilateral: Description- No Localized lymphadenopathy.      Assessment & Plan:  For your history of hypothryoid will get tsh, t3 and t4.  For your fatigue will get cbc, cmp, and b12 level.  For high cholesterol will get lipid panel  For your ear pain will rx augmentin. Some features of infection. Will also rx flonase nasal spray to for potential eustachian tube dysfunction. If ear pain on hering persists then refer to ENT.  Regarding your mri that was never done. Will give you Dr. Everlena CooperJaffe contact information but also ask our staff member Timothy Austin to call their office if they can reactivate that order.  Follow up date to be determined after lab review.  End of exam he remember hx of h pylori. He never took antibiotic. So he wants testing again to see if still +.  Timothy Austin, Timothy DredgeEdward, PA-C

## 2016-06-05 NOTE — Telephone Encounter (Signed)
Dr Everlena CooperJaffe office will call patient to schedule

## 2016-06-05 NOTE — Telephone Encounter (Signed)
rx vit d, lipitor and levothyroxine sent to pharmacy.

## 2016-06-05 NOTE — Patient Instructions (Addendum)
For your history of hypothyroid will get tsh, t3 and t4.  For your fatigue will get cbc, cmp, and b12 level.  For high cholesterol will get lipid panel  Will get vitamin D level to check due to low vit d history.   For your ear pain will rx augmentin. Some features of infection. Will also rx flonase nasal spray to for potential eustachian tube dysfunction. If ear pain on hering persists then refer to ENT.  Regarding your mri that was never done. Will give you Dr. Everlena CooperJaffe contact information but also ask our staff member Victorino DikeJennifer to call their office if they can reactivate that order.   Follow up date to be determined after lab review.  End of exam he remember hx of h pylori. He never took antibiotic. So he wants testing again to see if still +.

## 2016-06-06 LAB — H. PYLORI BREATH TEST: H. pylori Breath Test: NOT DETECTED

## 2016-07-09 ENCOUNTER — Other Ambulatory Visit: Payer: Self-pay | Admitting: Medical

## 2016-07-09 MED ORDER — LEVOTHYROXINE SODIUM 100 MCG PO TABS: 100.0000 ug | ORAL_TABLET | Freq: Every day | ORAL | 5 refills | Status: DC

## 2016-07-09 MED ORDER — ATORVASTATIN CALCIUM 10 MG PO TABS: 10.0000 mg | ORAL_TABLET | Freq: Every day | ORAL | 5 refills | Status: DC

## 2016-07-09 NOTE — Telephone Encounter (Signed)
Both Rxs were sent to pharmacy on 06/05/16, #90 x 1 refill. Spoke with pharmacist. Pt's insurance will only allow 30 day supply at a time. Pt just picked up atorvastatin today. They did not receive 06/05/16 levothyroxine Rx for 90 tablets. Refills re-sent for both meds. Left detailed message on pt's voicemail re: Rx completion and to call if any questions.

## 2016-07-09 NOTE — Telephone Encounter (Signed)
°  Relation to ZO:XWRUpt:self Call back number:[phone] Pharmacy:[facility]  Reason for call: pt is needing rx  levothyroxine (SYNTHROID, LEVOTHROID) 100 MCG tablet  And atorvastatin (LIPITOR) 10 MG tablet  Sent to [facility]

## 2016-07-23 ENCOUNTER — Telehealth: Payer: Self-pay | Admitting: Medical

## 2016-07-23 DIAGNOSIS — H9209 Otalgia, unspecified ear: Secondary | ICD-10-CM

## 2016-07-23 NOTE — Telephone Encounter (Signed)
Please advise 

## 2016-07-23 NOTE — Telephone Encounter (Signed)
Patient stated that he was seen for ear pain and was given an antibiotic, however he is still having pain. He would like a referral to an ENT. Please advise.   Patient phone: [phone]

## 2016-07-23 NOTE — Telephone Encounter (Signed)
Notify pt referal placed for ENT. But call her in a week and speak with Victorino DikeJennifer or Silva BandyKristi if he has not been contacted by ENT in one week. If pain severe/worsening before ent eval then be seen in our office.

## 2016-07-24 NOTE — Telephone Encounter (Signed)
Called and left a message for call back  

## 2016-07-25 NOTE — Telephone Encounter (Signed)
[facility]/16, awaiting appt

## 2016-07-30 NOTE — Telephone Encounter (Signed)
Called patient.  Phone went straight to voicemail.  Left a message for call back.

## 2016-08-04 ENCOUNTER — Other Ambulatory Visit: Payer: Self-pay | Admitting: Medical

## 2016-09-01 ENCOUNTER — Ambulatory Visit (INDEPENDENT_AMBULATORY_CARE_PROVIDER_SITE_OTHER): Payer: PPO | Admitting: Medical

## 2016-09-01 ENCOUNTER — Encounter: Payer: Self-pay | Admitting: Medical

## 2016-09-01 ENCOUNTER — Ambulatory Visit (HOSPITAL_BASED_OUTPATIENT_CLINIC_OR_DEPARTMENT_OTHER)
Admission: RE | Admit: 2016-09-01 | Discharge: 2016-09-01 | Disposition: A | Discharge status: Home / Self Care | Payer: PPO | Source: Ambulatory Visit | Attending: Medical | Admitting: Medical

## 2016-09-01 VITALS — BP 123/74 | HR 58 | Temp 97.8°F | Resp 16 | Ht 71.0 in | Wt 215.2 lb

## 2016-09-01 DIAGNOSIS — E039 Hypothyroidism, unspecified: Secondary | ICD-10-CM

## 2016-09-01 DIAGNOSIS — R932 Abnormal findings on diagnostic imaging of liver and biliary tract: Secondary | ICD-10-CM | POA: Diagnosis not present

## 2016-09-01 DIAGNOSIS — R109 Unspecified abdominal pain: Secondary | ICD-10-CM | POA: Insufficient documentation

## 2016-09-01 DIAGNOSIS — R7989 Other specified abnormal findings of blood chemistry: Secondary | ICD-10-CM | POA: Diagnosis not present

## 2016-09-01 LAB — CBC WITH DIFFERENTIAL/PLATELET
BASOS ABS: 0 10*3/uL (ref 0.0–0.1)
Basophils Relative: 0.4 % (ref 0.0–3.0)
EOS ABS: 0.1 10*3/uL (ref 0.0–0.7)
Eosinophils Relative: 2.2 % (ref 0.0–5.0)
HCT: 41.6 % (ref 39.0–52.0)
Hemoglobin: 14.3 g/dL (ref 13.0–17.0)
LYMPHS ABS: 2.4 10*3/uL (ref 0.7–4.0)
Lymphocytes Relative: 39.5 % (ref 12.0–46.0)
MCHC: 34.3 g/dL (ref 30.0–36.0)
MCV: 86.6 fl (ref 78.0–100.0)
MONO ABS: 0.4 10*3/uL (ref 0.1–1.0)
Monocytes Relative: 6.9 % (ref 3.0–12.0)
NEUTROS ABS: 3.1 10*3/uL (ref 1.4–7.7)
NEUTROS PCT: 51 % (ref 43.0–77.0)
PLATELETS: 169 10*3/uL (ref 150.0–400.0)
RBC: 4.81 Mil/uL (ref 4.22–5.81)
RDW: 12.5 % (ref 11.5–15.5)
WBC: 6.2 10*3/uL (ref 4.0–10.5)

## 2016-09-01 LAB — COMPREHENSIVE METABOLIC PANEL
ALT: 73 U/L — AB (ref 0–53)
AST: 33 U/L (ref 0–37)
Albumin: 4.5 g/dL (ref 3.5–5.2)
Alkaline Phosphatase: 65 U/L (ref 39–117)
BILIRUBIN TOTAL: 0.7 mg/dL (ref 0.2–1.2)
BUN: 10 mg/dL (ref 6–23)
CHLORIDE: 103 meq/L (ref 96–112)
CO2: 28 meq/L (ref 19–32)
CREATININE: 0.84 mg/dL (ref 0.40–1.50)
Calcium: 9.8 mg/dL (ref 8.4–10.5)
GFR: 113.18 mL/min (ref >60.00)
GLUCOSE: 92 mg/dL (ref 70–99)
Potassium: 3.8 mEq/L (ref 3.5–5.1)
Sodium: 137 mEq/L (ref 135–145)
Total Protein: 7.8 g/dL (ref 6.0–8.3)

## 2016-09-01 LAB — VITAMIN D 25 HYDROXY (VIT D DEFICIENCY, FRACTURES): VITD: 35.36 ng/mL (ref 30.00–100.00)

## 2016-09-01 LAB — LIPASE: Lipase: 16 U/L (ref 11.0–59.0)

## 2016-09-01 LAB — AMYLASE: Amylase: 39 U/L (ref 27–131)

## 2016-09-01 MED ORDER — RANITIDINE HCL 150 MG PO CAPS: 150.0000 mg | ORAL_CAPSULE | Freq: Two times a day (BID) | ORAL | 0 refills | Status: DC

## 2016-09-01 NOTE — Progress Notes (Signed)
Pre visit review using our clinic review tool, if applicable. No additional management support is needed unless otherwise documented below in the visit note/SLS  

## 2016-09-01 NOTE — Progress Notes (Signed)
Subjective:    Patient ID: Timothy Austin, male    DOB: Jun 05, 1986, 31 y.o.   MRN: [id number]  HPI  Pt in for follow up.  Pt reports 10 days ago he had some epigastric area pain. Pt states seems to occur after some cardio exercises and was doing some abdomen exercises. But he also specifies that it was not just after exercises but had come on occasiosionally as well. Pt states pain random.  Some pain on and off after eating at times. Pt in October had negative h pylori test in the past. Pt in past did have some very atypical pain lower rib regions in the past.jan 2017. Pt had normal ekg on that day. Xray of pt chest in jan 2017 and in September was normal. In 2016 pt had ct abdomen in past.. Showed normal spleen, liver and kidney on those test. Pt has not tried anything otc for stomach pain.   Pt never called Dr. Everlena Cooper office regarding  maybe getting mri.(hx of dizziness but none recently or presently)   He also never saw the ENT office. He states his ear feels fine now.  Pt vitamin d was low 2 months ago. Pt did use ergcaliferol one tab a week. Then 2-3 weeks ran out. Has not been on otc supplement vit d.   Pt thyroid hormone level looked good 3 months ago.    Review of Systems  Constitutional: Negative for chills, fatigue and fever.  HENT: Negative for congestion, ear pain, sinus pain and sinus pressure.   Respiratory: Negative for cough, chest tightness, shortness of breath and wheezing.   Cardiovascular: Negative for chest pain and palpitations.  Gastrointestinal: Positive for abdominal pain. Negative for blood in stool.  Musculoskeletal: Negative for back pain.  Skin: Negative for rash.  Neurological: Negative for dizziness, weakness, light-headedness and numbness.       Hx of dizziness. Never got mri. No dizziness reported today. Pt never called Dr. Everlena Cooper.  Hematological: Negative for adenopathy. Does not bruise/bleed easily.  Psychiatric/Behavioral: Negative for agitation,  confusion and hallucinations. The patient is not nervous/anxious.     Past Medical History:  Diagnosis Date  . Osteochondritis dissecans of left knee   . Wears glasses      Social History   Social History  . Marital status: Married    Spouse name: N/A  . Number of children: N/A  . Years of education: N/A   Occupational History  . Not on file.   Social History Main Topics  . Smoking status: Never Smoker  . Smokeless tobacco: Never Used  . Alcohol use No  . Drug use: No  . Sexual activity: Yes   Other Topics Concern  . Not on file   Social History Narrative  . No narrative on file    Past Surgical History:  Procedure Laterality Date  . APPENDECTOMY  age 83  . CHONDROPLASTY Left 03/06/2015   Procedure: CHONDROPLASTY;  Surgeon: Eugenia Mcalpine, MD;  Location: [facility];  Service: Orthopedics;  Laterality: Left;  . KNEE ARTHROSCOPY Left 03/06/2015   Procedure: ARTHROSCOPY KNEE DEBRIDEMENT  of OCD LESION  AND HARVEST CARTILAGE;  Surgeon: Eugenia Mcalpine, MD;  Location[facility];  Service: Orthopedics;  Laterality: Left;    Family History  Problem Relation Age of Onset  . Hypertension Mother   . Diabetes Mother   . Hypertension Father   . Diabetes Father     No Known Allergies  Current Outpatient Prescriptions on  File Prior to Visit  Medication Sig Dispense Refill  . atorvastatin (LIPITOR) 10 MG tablet Take 1 tablet (10 mg total) by mouth daily. 30 tablet 5  . fluticasone (FLONASE) 50 MCG/ACT nasal spray Place 2 sprays into both nostrils daily. (Patient taking differently: Place 2 sprays into both nostrils daily as needed. ) 16 g 1  . levothyroxine (SYNTHROID, LEVOTHROID) 100 MCG tablet Take 1 tablet (100 mcg total) by mouth daily. 30 tablet 5   No current facility-administered medications on file prior to visit.     BP 123/74 (BP Location: Right Arm, Patient Position: Sitting, Cuff Size: Large)   Pulse (!) 58   Temp 97.8 F  (36.6 C) (Oral)   Resp 16   Ht 5\' 11"  (1.803 m)   Wt 215 lb 4 oz (97.6 kg)   SpO2 100%   BMI 30.02 kg/m      Objective:   Physical Exam  General  Mental Status - Alert. General Appearance - Well groomed. Not in acute distress.  Skin Rashes- No Rashes.  HEENT Head- Normal. Ear Auditory Canal - Left- Normal. Right - Normal.Tympanic Membrane- Left- Normal. Right- Normal. Eye Sclera/Conjunctiva- Left- Normal. Right- Normal. Nose & Sinuses Nasal Mucosa- Left-  Boggy and Congested. Right-  Boggy and  Congested.Bilateral no maxillary and no frontal sinus pressure. Mouth & Throat Lips: Upper Lip- Normal: no dryness, cracking, pallor, cyanosis, or vesicular eruption. Lower Lip-Normal: no dryness, cracking, pallor, cyanosis or vesicular eruption. Buccal Mucosa- Bilateral- No Aphthous ulcers. Oropharynx- No Discharge or Erythema. Tonsils: Characteristics- Bilateral- No Erythema or Congestion. Size/Enlargement- Bilateral- No enlargement. Discharge- bilateral-None.  Neck Neck- Supple. No Masses.   Chest and Lung Exam Auscultation: Breath Sounds:-Clear even and unlabored.  Cardiovascular Auscultation:Rythm- Regular, rate and rhythm. Murmurs & Other Heart Sounds:Ausculatation of the heart reveal- No Murmurs.  Lymphatic Head & Neck General Head & Neck Lymphatics: Bilateral: Description- No Localized lymphadenopathy.    Neurologic Cranial Nerve exam:- CN III-XII intact(No nystagmus), symmetric smile. Strength:- 5/5 equal and symmetric strength both upper and lower extremities.  Abdomen- mild- moderate epigastric and ruq pain. No rebound or guarding.     Assessment & Plan:  For your abdomen pain will get stat Abdomen US at 12:30. Also get labs today. Rx ranitidine.  Get vitamin d level today. Continue your same dose thyroid med. Thryoid to be repeated in 4 months.  Follow up up in 4 months but sooner if needed for high level abdomen pain.  If studies negative and you  don't respond to ranitidine then will refer you to GI.   Timothy Austin, Timothy DredgeEdward, PA-C

## 2016-09-01 NOTE — Patient Instructions (Addendum)
For your abdomen pain will get stat Abdomen US at 12:30. Also get labs today. Rx ranitidine.  Get vitamin d level today(note if vit d in normal range then 3,000 units otc daily would be fine.. Continue your same dose thyroid med. Thryoid to be repeated in 4 months.  Follow up up in 4 months but sooner if needed for high level abdomen pain.  If studies negative and you don't respond to ranitidine then will refer you to GI.(also eat healthy diet)    Food Choices for Gastroesophageal Reflux Disease, Adult When you have gastroesophageal reflux disease (GERD), the foods you eat and your eating habits are very important. Choosing the right foods can help ease your discomfort. What guidelines do I need to follow?  Choose fruits, vegetables, whole grains, and low-fat dairy products.  Choose low-fat meat, fish, and poultry.  Limit fats such as oils, salad dressings, butter, nuts, and avocado.  Keep a food diary. This helps you identify foods that cause symptoms.  Avoid foods that cause symptoms. These may be different for everyone.  Eat small meals often instead of 3 large meals a day.  Eat your meals slowly, in a place where you are relaxed.  Limit fried foods.  Cook foods using methods other than frying.  Avoid drinking alcohol.  Avoid drinking large amounts of liquids with your meals.  Avoid bending over or lying down until 2-3 hours after eating. What foods are not recommended? These are some foods and drinks that may make your symptoms worse: Vegetables  Tomatoes. Tomato juice. Tomato and spaghetti sauce. Chili peppers. Onion and garlic. Horseradish. Fruits  Oranges, grapefruit, and lemon (fruit and juice). Meats  High-fat meats, fish, and poultry. This includes hot dogs, ribs, ham, sausage, salami, and bacon. Dairy  Whole milk and chocolate milk. Sour cream. Cream. Butter. Ice cream. Cream cheese. Drinks  Coffee and tea. Bubbly (carbonated) drinks or energy  drinks. Condiments  Hot sauce. Barbecue sauce. Sweets/Desserts  Chocolate and cocoa. Donuts. Peppermint and spearmint. Fats and Oils  High-fat foods. This includes [place] fries and potato chips. Other  Vinegar. Strong spices. This includes black pepper, white pepper, red pepper, cayenne, curry powder, cloves, ginger, and chili powder. The items listed above may not be a complete list of foods and drinks to avoid. Contact your dietitian for more information.  This information is not intended to replace advice given to you by your health care provider. Make sure you discuss any questions you have with your health care provider. Document Released: 02/03/2012 Document Revised: 01/10/2016 Document Reviewed: 06/08/2013 Elsevier Interactive Patient Education  2017 [employer]

## 2016-09-05 ENCOUNTER — Encounter: Payer: Self-pay | Admitting: Medical

## 2016-09-05 ENCOUNTER — Ambulatory Visit (INDEPENDENT_AMBULATORY_CARE_PROVIDER_SITE_OTHER): Payer: PPO | Admitting: Medical

## 2016-09-05 ENCOUNTER — Telehealth: Payer: Self-pay | Admitting: Neurology

## 2016-09-05 VITALS — BP 135/83 | HR 68 | Temp 98.0°F | Resp 16 | Ht 71.0 in | Wt 213.2 lb

## 2016-09-05 DIAGNOSIS — H6690 Otitis media, unspecified, unspecified ear: Secondary | ICD-10-CM

## 2016-09-05 DIAGNOSIS — E785 Hyperlipidemia, unspecified: Secondary | ICD-10-CM

## 2016-09-05 DIAGNOSIS — H9202 Otalgia, left ear: Secondary | ICD-10-CM | POA: Diagnosis not present

## 2016-09-05 DIAGNOSIS — R0981 Nasal congestion: Secondary | ICD-10-CM | POA: Diagnosis not present

## 2016-09-05 DIAGNOSIS — R42 Dizziness and giddiness: Secondary | ICD-10-CM

## 2016-09-05 DIAGNOSIS — R410 Disorientation, unspecified: Secondary | ICD-10-CM

## 2016-09-05 MED ORDER — FLUTICASONE PROPIONATE 50 MCG/ACT NA SUSP: 2.0000 | Freq: Every day | NASAL | 1 refills | Status: DC

## 2016-09-05 MED ORDER — AMOXICILLIN-POT CLAVULANATE 875-125 MG PO TABS: 1.0000 | ORAL_TABLET | Freq: Two times a day (BID) | ORAL | 0 refills | Status: DC

## 2016-09-05 NOTE — Telephone Encounter (Signed)
Returned call. No answer.  

## 2016-09-05 NOTE — Progress Notes (Signed)
Pre visit review using our clinic review tool, if applicable. No additional management support is needed unless otherwise documented below in the visit note/SLS  

## 2016-09-05 NOTE — Telephone Encounter (Signed)
Patient needs to talk to someone about having a MRI done please call [phone]

## 2016-09-05 NOTE — Patient Instructions (Addendum)
For your nasal congestion and ear pain will rx flonase.  For early otitis media will rx augmentin antibiotic.(probable eustachian tube dyfunction component(  Please attend the ENT end of month   Follow up with our clinic as needed before or after ENT appointment as needed

## 2016-09-05 NOTE — Progress Notes (Signed)
Subjective:    Patient ID: Timothy Austin, male    DOB: 1986-06-11, 31 y.o.   MRN: [id number]  HPI   Pt states last night onset of ear pain and some today. He had transient brief dizziness today but that has resolved. Pt states in past about 5-6 years ago he would have some daily  left ear [place]  Pt has appointment with ENT on January 31st.. I had referred pt in early December.   Pt states when he feels like his left ear feel blocked all the time.  Pt in the past was on flonase but not recently.  No recent fever, no chills or sweats.   He at times descibes mild tinnitus to left ear.    Review of Systems  Constitutional: Negative for chills.  HENT: Positive for congestion, ear pain and tinnitus. Negative for sinus pain, sinus pressure, trouble swallowing and voice change.        Maybe had noise. Like heart beat.  Cardiovascular: Negative for chest pain and palpitations.  Gastrointestinal: Negative for abdominal pain.  Musculoskeletal: Negative for back pain.  Neurological: Positive for dizziness. Negative for tremors, seizures, weakness and headaches.       Slight transient earlier today.   Hematological: Negative for adenopathy. Does not bruise/bleed easily.  Psychiatric/Behavioral: Negative for behavioral problems and confusion.   Past Medical History:  Diagnosis Date  . Osteochondritis dissecans of left knee   . Wears glasses      Social History   Social History  . Marital status: Married    Spouse name: N/A  . Number of children: N/A  . Years of education: N/A   Occupational History  . Not on file.   Social History Main Topics  . Smoking status: Never Smoker  . Smokeless tobacco: Never Used  . Alcohol use No  . Drug use: No  . Sexual activity: Yes   Other Topics Concern  . Not on file   Social History Narrative  . No narrative on file    Past Surgical History:  Procedure Laterality  Date  . APPENDECTOMY  age 51  . CHONDROPLASTY Left 03/06/2015   Procedure: CHONDROPLASTY;  Surgeon: Eugenia Mcalpine, MD;  Location: [facility];  Service: Orthopedics;  Laterality: Left;  . KNEE ARTHROSCOPY Left 03/06/2015   Procedure: ARTHROSCOPY KNEE DEBRIDEMENT  of OCD LESION  AND HARVEST CARTILAGE;  Surgeon: Eugenia Mcalpine, MD;  Location: [facility];  Service: Orthopedics;  Laterality: Left;    Family History  Problem Relation Age of Onset  . Hypertension Mother   . Diabetes Mother   . Hypertension Father   . Diabetes Father     No Known Allergies  Current Outpatient Prescriptions on File Prior to Visit  Medication Sig Dispense Refill  . atorvastatin (LIPITOR) 10 MG tablet Take 1 tablet (10 mg total) by mouth daily. 30 tablet 5  . levothyroxine (SYNTHROID, LEVOTHROID) 100 MCG tablet Take 1 tablet (100 mcg total) by mouth daily. 30 tablet 5  . ranitidine (ZANTAC) 150 MG capsule Take 1 capsule (150 mg total) by mouth 2 (two) times daily. 60 capsule 0  . fluticasone (FLONASE) 50 MCG/ACT nasal spray Place 2 sprays into both nostrils daily. (Patient not taking: Reported on 09/05/2016) 16 g 1   No current facility-administered medications on file prior to visit.     BP 135/83 (BP Location: Right Arm, Patient Position: Sitting, Cuff  Size: Large)   Pulse 68   Temp 98 F (36.7 C) (Oral)   Resp 16   Ht 5\' 11"  (1.803 m)   Wt 213 lb 4 oz (96.7 kg)   SpO2 99%   BMI 29.74 kg/m       Objective:   Physical Exam   General  Mental Status - Alert. General Appearance - Well groomed. Not in acute distress.  Skin Rashes- No Rashes.  HEENT Head- Normal. Ear Auditory Canal - Left- Normal. Right - Normal.Tympanic Membrane- Left- mild red on edge of time. Mild bulge appearance Right- Normal. Eye Sclera/Conjunctiva- Left- Normal. Right- Normal. Nose & Sinuses Nasal Mucosa- Left-  Boggy and Congested. Right-  Boggy and  Congested.Bilateral no  maxillary and  no  frontal sinus pressure. Mouth & Throat Lips: Upper Lip- Normal: no dryness, cracking, pallor, cyanosis, or vesicular eruption. Lower Lip-Normal: no dryness, cracking, pallor, cyanosis or vesicular eruption. Buccal Mucosa- Bilateral- No Aphthous ulcers. Oropharynx- No Discharge or Erythema. Tonsils: Characteristics- Bilateral- No Erythema or Congestion. Size/Enlargement- Bilateral- No enlargement. Discharge- bilateral-None.  Neck Neck- Supple. No Masses.   Chest and Lung Exam Auscultation: Breath Sounds:-Clear even and unlabored.  Cardiovascular Auscultation:Rythm- Regular, rate and rhythm. Murmurs & Other Heart Sounds:Ausculatation of the heart reveal- No Murmurs.  Lymphatic Head & Neck General Head & Neck Lymphatics: Bilateral: Description- No Localized lymphadenopathy.       Assessment & Plan:  For your nasal congestion and ear pain will rx flonase.  For early otitis media will rx augmentin antibiotic.(probable eustachian tube dysfunction component)  Please attend the ENT end of month   Follow up with our clinic as needed before or after ENT appointment as needed  Eliott Amparan, Timothy DredgeEdward, PA-C

## 2016-09-05 NOTE — Telephone Encounter (Signed)
Patient states he would like to reschedule MRI  Because now his sx(s) (dizziness) has increased. Advised would fwd to Dr. Everlena CooperJaffe. Not sure if he is to come in first or able to proceed w/ the MRI. Patient agreed.

## 2016-09-07 NOTE — Telephone Encounter (Signed)
Since it has been less than a year since I saw him, let us just order the MRI of brain with and without contrast for dizziness and confusion.  We will contact him with results and whether follow up is needed.

## 2016-09-08 ENCOUNTER — Telehealth: Payer: Self-pay | Admitting: Neurology

## 2016-09-08 NOTE — Telephone Encounter (Signed)
Called patient to inform MRI placed. No answer. Left vmail per DPR.

## 2016-09-08 NOTE — Telephone Encounter (Signed)
Depolo, Gerritt 1986/08/17. His # [phone] [phone]. He would like you to call him regarding setting up an MRI . Thank you

## 2016-09-09 NOTE — Telephone Encounter (Signed)
Returned call. No answer. Ordered MRI per Dr. Everlena CooperJaffe yesterday.

## 2016-09-09 NOTE — Telephone Encounter (Signed)
Spoke to patient. Advised GI will be contacting him soon for scheduling. Patient verbalized understanding.

## 2016-09-11 ENCOUNTER — Ambulatory Visit (INDEPENDENT_AMBULATORY_CARE_PROVIDER_SITE_OTHER): Payer: PPO | Admitting: Medical

## 2016-09-11 VITALS — BP 123/77 | Ht 71.0 in | Wt 212.2 lb

## 2016-09-11 DIAGNOSIS — J029 Acute pharyngitis, unspecified: Secondary | ICD-10-CM

## 2016-09-11 DIAGNOSIS — H6690 Otitis media, unspecified, unspecified ear: Secondary | ICD-10-CM

## 2016-09-11 DIAGNOSIS — R5081 Fever presenting with conditions classified elsewhere: Secondary | ICD-10-CM | POA: Diagnosis not present

## 2016-09-11 DIAGNOSIS — J111 Influenza due to unidentified influenza virus with other respiratory manifestations: Secondary | ICD-10-CM

## 2016-09-11 LAB — POC INFLUENZA A&B (BINAX/QUICKVUE)
INFLUENZA A, POC: POSITIVE — AB
Influenza B, POC: NEGATIVE

## 2016-09-11 LAB — POCT RAPID STREP A (OFFICE): RAPID STREP A SCREEN: NEGATIVE

## 2016-09-11 MED ORDER — OSELTAMIVIR PHOSPHATE 75 MG PO CAPS: 75.0000 mg | ORAL_CAPSULE | Freq: Two times a day (BID) | ORAL | 0 refills | Status: DC

## 2016-09-11 MED ORDER — CEFTRIAXONE SODIUM 1 G IJ SOLR
1.0000 g | Freq: Once | INTRAMUSCULAR | Status: AC
  Administered 2016-09-11: 1 g via INTRAMUSCULAR

## 2016-09-11 MED ORDER — BENZONATATE 100 MG PO CAPS: 100.0000 mg | ORAL_CAPSULE | Freq: Three times a day (TID) | ORAL | 0 refills | Status: DC | PRN

## 2016-09-11 NOTE — Patient Instructions (Addendum)
You have the flu. I am treating you with tamiflu. Rest, hydrate and take tylenol for fever. Alternate ibuprofen as discussed  if necessary for body ache or fever. Hydrate well.  Your ears look worse presently despite being on augmentin and your throat has signs of infection. I want you to continue augmentin and we gave you rocephin 1 gram im.   If you get worse despite the above treamtment let us know. If severe worse signs or symptoms then ED evaluation.  Follow up in 7 days or as needed

## 2016-09-11 NOTE — Progress Notes (Signed)
Pre visit review using our clinic tool,if applicable. No additional management support is needed unless otherwise documented below in the visit note.  

## 2016-09-11 NOTE — Addendum Note (Signed)
Addended by: Gwenevere AbbotSAGUIER, Nolberto Cheuvront M on: 09/11/2016 01:56 PM   Modules accepted: Orders

## 2016-09-11 NOTE — Progress Notes (Signed)
Subjective:    Patient ID: Timothy Austin, male    DOB: [birth year], 31 y.o.   MRN: [id number]  HPI  Pt in with recent st and feels swollen. This started on Tuesday morning. Mild nausea and vomited yesterday. He felt nausea and he gagged himself to make himself vomit one time. Then nausea got better. Pt has some mild arm pains and lower back aches. Pt is sweating and feels warm.   Pt on his last visit evaluated for ear pain. Rx of augmentin given for ear infection.  Pt did not get flu vaccine this year.  Review of Systems  Constitutional: Positive for chills, diaphoresis, fatigue and fever.  HENT: Positive for congestion, ear pain, sinus pain, sinus pressure and sore throat. Negative for postnasal drip.        Ears feel better.  Respiratory: Negative for chest tightness, shortness of breath and wheezing.   Cardiovascular: Negative for chest pain and palpitations.  Gastrointestinal: Negative for abdominal pain, diarrhea and nausea.  Musculoskeletal: Negative for back pain.  Skin: Negative for rash.  Neurological: Positive for headaches. Negative for seizures, syncope, speech difficulty, weakness and numbness.       Faint ha.  Psychiatric/Behavioral: Negative for behavioral problems and confusion.    Past Medical History:  Diagnosis Date  . Osteochondritis dissecans of left knee   . Wears glasses      Social History   Social History  . Marital status: Married    Spouse name: N/A  . Number of children: N/A  . Years of education: N/A   Occupational History  . Not on file.   Social History Main Topics  . Smoking status: Never Smoker  . Smokeless tobacco: Never Used  . Alcohol use No  . Drug use: No  . Sexual activity: Yes   Other Topics Concern  . Not on file   Social History Narrative  . No narrative on file    Past Surgical History:  Procedure Laterality Date  . APPENDECTOMY  age 32  . CHONDROPLASTY Left 03/06/2015   Procedure: CHONDROPLASTY;  Surgeon:  Eugenia Mcalpine, MD;  Location: [facility];  Service: Orthopedics;  Laterality: Left;  . KNEE ARTHROSCOPY Left 03/06/2015   Procedure: ARTHROSCOPY KNEE DEBRIDEMENT  of OCD LESION  AND HARVEST CARTILAGE;  Surgeon: Eugenia Mcalpine, MD;  Location: [facility];  Service: Orthopedics;  Laterality: Left;    Family History  Problem Relation Age of Onset  . Hypertension Mother   . Diabetes Mother   . Hypertension Father   . Diabetes Father     No Known Allergies  Current Outpatient Prescriptions on File Prior to Visit  Medication Sig Dispense Refill  . amoxicillin-clavulanate (AUGMENTIN) 875-125 MG tablet Take 1 tablet by mouth 2 (two) times daily. 20 tablet 0  . atorvastatin (LIPITOR) 10 MG tablet Take 1 tablet (10 mg total) by mouth daily. 30 tablet 5  . fluticasone (FLONASE) 50 MCG/ACT nasal spray Place 2 sprays into both nostrils daily. 16 g 1  . levothyroxine (SYNTHROID, LEVOTHROID) 100 MCG tablet Take 1 tablet (100 mcg total) by mouth daily. 30 tablet 5  . ranitidine (ZANTAC) 150 MG capsule Take 1 capsule (150 mg total) by mouth 2 (two) times daily. 60 capsule 0   No current facility-administered medications on file prior to visit.     BP 123/77   Ht 5\' 11"  (1.803 m)   Wt 212 lb 3.2 oz (96.3 kg)   BMI 29.60 kg/m  Objective:   Physical Exam   General  Mental Status - Alert. General Appearance - Well groomed. Not in acute distress.  Skin Rashes- No Rashes.  HEENT Head- Normal. Ear Auditory Canal - Left- Normal. Right - Normal.Tympanic Membrane- Left- moderate diffuse redness. Right- mild red Eye Sclera/Conjunctiva- Left- Normal. Right- Normal. Nose & Sinuses Nasal Mucosa- Left-  Boggy and Congested. Right-  Boggy and  Congested.Bilateral no  maxillary and  No frontal sinus pressure. Mouth & Throat Lips: Upper Lip- Normal: no dryness, cracking, pallor, cyanosis, or vesicular eruption. Lower Lip-Normal: no dryness, cracking, pallor,  cyanosis or vesicular eruption. Buccal Mucosa- Bilateral- No Aphthous ulcers. Oropharynx- No Discharge or Erythema. Tonsils: Characteristics- Bilateral-  Erythema or Congestion. Size/Enlargement- 1-2+ Bilateral- No enlargement. Discharge- bilateral-None.  Neck Neck- Supple. No Masses.  Chest and Lung Exam Auscultation: Breath Sounds:-Clear even and unlabored.  Cardiovascular Auscultation:Rythm- Regular, rate and rhythm. Murmurs & Other Heart Sounds:Ausculatation of the heart reveal- No Murmurs.  Lymphatic Head & Neck General Head & Neck Lymphatics: Bilateral: Description- No Localized lymphadenopathy.      Assessment & Plan:  You have the flu. I am treating you with tamiflu. Rest, hydrate and take tylenol for fever. Alternate ibuprofen as discussed  if necessary for body ache or fever. Hydrate well.  Your ears look worse presently despite being on augmentin and your throat has signs of infection. I want you to continue augmentin and we gave you rocephin 1 gram im.   If you get worse despite the above treamtment let us know. If severe worse signs or symptoms then ED evaluation.  Follow up in 7 days or as needed  Flu +. Strep negative(in office)

## 2016-09-16 ENCOUNTER — Other Ambulatory Visit: Payer: Self-pay | Admitting: Neurology

## 2016-09-16 ENCOUNTER — Ambulatory Visit
Admission: RE | Admit: 2016-09-16 | Discharge: 2016-09-16 | Disposition: A | Discharge status: Home / Self Care | Payer: PPO | Source: Ambulatory Visit | Attending: Neurology | Admitting: Neurology

## 2016-09-16 DIAGNOSIS — R42 Dizziness and giddiness: Secondary | ICD-10-CM

## 2016-09-16 DIAGNOSIS — R410 Disorientation, unspecified: Secondary | ICD-10-CM

## 2016-09-16 MED ORDER — GADOBENATE DIMEGLUMINE 529 MG/ML IV SOLN: 18.0000 mL | Freq: Once | INTRAVENOUS | Status: DC | PRN

## 2016-09-17 ENCOUNTER — Telehealth: Payer: Self-pay

## 2016-09-17 ENCOUNTER — Other Ambulatory Visit: Payer: PPO

## 2016-09-17 NOTE — Telephone Encounter (Signed)
-----   Message from Drema DallasAdam R Jaffe, DO sent at 09/17/2016  7:42 AM EST ----- MRI of brain is normal.

## 2016-09-17 NOTE — Telephone Encounter (Signed)
Patient returned call. Gave results. Patient verbalized understanding. Patient says still having symptoms.Transferred patient to front office to schedule appt.

## 2016-09-17 NOTE — Telephone Encounter (Signed)
Called patient.  No answer.

## 2016-09-22 ENCOUNTER — Encounter: Payer: Self-pay | Admitting: Medical

## 2016-09-24 ENCOUNTER — Encounter: Payer: Self-pay | Admitting: Neurology

## 2016-10-01 ENCOUNTER — Other Ambulatory Visit: Payer: Self-pay | Admitting: Medical

## 2016-10-02 ENCOUNTER — Encounter: Payer: Self-pay | Admitting: Medical

## 2016-10-30 ENCOUNTER — Telehealth: Payer: Self-pay | Admitting: Medical

## 2016-10-30 ENCOUNTER — Ambulatory Visit: Payer: PPO | Admitting: Medical

## 2016-10-30 NOTE — Telephone Encounter (Signed)
Pt called in at 9:03 to reschedule his appt. Pt's new appt is Wednesday 11/05/16 .     Should pt be charged?

## 2016-10-30 NOTE — Telephone Encounter (Signed)
Charge if in past he ha same day cancellation without notification or excuse.

## 2016-11-05 ENCOUNTER — Ambulatory Visit: Payer: PPO | Admitting: Medical

## 2016-11-24 ENCOUNTER — Other Ambulatory Visit: Payer: Self-pay | Admitting: Medical

## 2016-12-17 ENCOUNTER — Ambulatory Visit (INDEPENDENT_AMBULATORY_CARE_PROVIDER_SITE_OTHER): Payer: PPO | Admitting: Medical

## 2016-12-17 ENCOUNTER — Encounter: Payer: Self-pay | Admitting: Medical

## 2016-12-17 VITALS — BP 117/71 | HR 75 | Temp 97.9°F | Resp 16 | Ht 71.0 in | Wt 216.0 lb

## 2016-12-17 DIAGNOSIS — H538 Other visual disturbances: Secondary | ICD-10-CM | POA: Diagnosis not present

## 2016-12-17 DIAGNOSIS — E785 Hyperlipidemia, unspecified: Secondary | ICD-10-CM

## 2016-12-17 DIAGNOSIS — R42 Dizziness and giddiness: Secondary | ICD-10-CM | POA: Diagnosis not present

## 2016-12-17 DIAGNOSIS — R5383 Other fatigue: Secondary | ICD-10-CM | POA: Diagnosis not present

## 2016-12-17 DIAGNOSIS — E039 Hypothyroidism, unspecified: Secondary | ICD-10-CM

## 2016-12-17 LAB — CBC WITH DIFFERENTIAL/PLATELET
Basophils Absolute: 0 10*3/uL (ref 0.0–0.1)
Basophils Relative: 0.6 % (ref 0.0–3.0)
EOS PCT: 1.8 % (ref 0.0–5.0)
Eosinophils Absolute: 0.1 10*3/uL (ref 0.0–0.7)
HCT: 42.1 % (ref 39.0–52.0)
Hemoglobin: 14.3 g/dL (ref 13.0–17.0)
LYMPHS ABS: 2.3 10*3/uL (ref 0.7–4.0)
Lymphocytes Relative: 42.7 % (ref 12.0–46.0)
MCHC: 34 g/dL (ref 30.0–36.0)
MCV: 87.8 fl (ref 78.0–100.0)
MONOS PCT: 7.4 % (ref 3.0–12.0)
Monocytes Absolute: 0.4 10*3/uL (ref 0.1–1.0)
NEUTROS ABS: 2.6 10*3/uL (ref 1.4–7.7)
NEUTROS PCT: 47.5 % (ref 43.0–77.0)
PLATELETS: 174 10*3/uL (ref 150.0–400.0)
RBC: 4.8 Mil/uL (ref 4.22–5.81)
RDW: 12.7 % (ref 11.5–15.5)
WBC: 5.4 10*3/uL (ref 4.0–10.5)

## 2016-12-17 LAB — T4, FREE: FREE T4: 0.82 ng/dL (ref 0.60–1.60)

## 2016-12-17 LAB — LIPID PANEL
CHOLESTEROL: 185 mg/dL (ref 0–200)
HDL: 34.5 mg/dL — ABNORMAL LOW (ref >39.00)
LDL Cholesterol: 138 mg/dL — ABNORMAL HIGH (ref 0–99)
NonHDL: 150.27
TRIGLYCERIDES: 63 mg/dL (ref 0.0–149.0)
Total CHOL/HDL Ratio: 5
VLDL: 12.6 mg/dL (ref 0.0–40.0)

## 2016-12-17 LAB — T3, FREE: T3 FREE: 3.8 pg/mL (ref 2.3–4.2)

## 2016-12-17 LAB — VITAMIN B12: Vitamin B-12: 289 pg/mL (ref 211–911)

## 2016-12-17 LAB — TSH: TSH: 1.8 u[IU]/mL (ref 0.35–4.50)

## 2016-12-17 NOTE — Patient Instructions (Addendum)
Dizziness is recently  much improved. But will follow this closely and see if returns. Reassuring that prior mri negative.  Will get labs today to evaluate your chronic conditions low thyroid, low cholesterol and work up mild fatigue. Get cbc,cmp, tsh, b12, and vit d level.  For mild occasional blurred vision see optometrist who you saw last. For eye itching rx patanolol.  Mild exercise daily. When dizzy sensation you might check you blood sugar. You mentioned this as a cause. This might be helpful to know. Generally sugar below 70 could cause dizziness. Over 180 might cause dizziness. Relion glucometer otc reasanably priced. Check bp and pulse also when dizzy. Note position when dizzy or if occurs on turning head.  May try to rx non generic synthroid as you may feel better overall with this.  Follow cholesterol level and may need to restart med.  Follow follow up  1 month or as needed

## 2016-12-17 NOTE — Progress Notes (Signed)
Pre visit review using our clinic review tool, if applicable. No additional management support is needed unless otherwise documented below in the visit note. 

## 2016-12-17 NOTE — Progress Notes (Signed)
Subjective:    Patient ID: Timothy Austin, male    DOB: 10-31-1985, 31 y.o.   MRN: [id number]  HPI  Pt in states he went to ent office for ringing of his ears. His ringing of his ears got better.  Pt was feeling dizziness daily for a while/chronic in past. He could not focus and did not feel well. In past had referred to both ent and neurologist.  Pt for a brief time states stopped both lipitor and synthroid for a while and felt better.  Pt did restart the levothyroxine. Pt feels better currently regarding dizziness.  He states he also stopped multivitamins otc.   Pt had his eyes checked and got new prescription. Sometimes he states will get transient blurred vision.  Pt had mri in past for his above symptoms and study came back negative.   Review of Systems  Constitutional: Negative for chills, fatigue and fever.       Only  Mild occasional fatigue.  HENT: Negative for dental problem, drooling, ear discharge, facial swelling, hearing loss and mouth sores.        Tinnitus better  Eyes:       Blurred visoin occasionally.  Respiratory: Negative for cough, chest tightness, shortness of breath and wheezing.   Cardiovascular: Negative for chest pain and palpitations.  Gastrointestinal: Negative for abdominal pain, diarrhea, nausea and vomiting.  Musculoskeletal: Negative for back pain and neck pain.  Skin: Negative for rash.  Neurological: Negative for dizziness, weakness, light-headedness, numbness and headaches.       Better.  Hematological: Negative for adenopathy. Does not bruise/bleed easily.  Psychiatric/Behavioral: Negative for behavioral problems, confusion and suicidal ideas. The patient is not nervous/anxious.     Past Medical History:  Diagnosis Date  . Osteochondritis dissecans of left knee   . Wears glasses      Social History   Social History  . Marital status: Married    Spouse name: N/A  . Number of children: N/A  . Years of education: N/A    Occupational History  . Not on file.   Social History Main Topics  . Smoking status: Never Smoker  . Smokeless tobacco: Never Used  . Alcohol use No  . Drug use: No  . Sexual activity: Yes   Other Topics Concern  . Not on file   Social History Narrative  . No narrative on file    Past Surgical History:  Procedure Laterality Date  . APPENDECTOMY  age 37  . CHONDROPLASTY Left 03/06/2015   Procedure: CHONDROPLASTY;  Surgeon: Eugenia Mcalpine, MD;  Location: [facility];  Service: Orthopedics;  Laterality: Left;  . KNEE ARTHROSCOPY Left 03/06/2015   Procedure: ARTHROSCOPY KNEE DEBRIDEMENT  of OCD LESION  AND HARVEST CARTILAGE;  Surgeon: Eugenia Mcalpine, MD;  Location: [facility];  Service: Orthopedics;  Laterality: Left;    Family History  Problem Relation Age of Onset  . Hypertension Mother   . Diabetes Mother   . Hypertension Father   . Diabetes Father     No Known Allergies  Current Outpatient Prescriptions on File Prior to Visit  Medication Sig Dispense Refill  . atorvastatin (LIPITOR) 10 MG tablet Take 1 tablet (10 mg total) by mouth daily. 30 tablet 5  . levothyroxine (SYNTHROID, LEVOTHROID) 100 MCG tablet Take 1 tablet (100 mcg total) by mouth daily. 30 tablet 5  . levothyroxine (SYNTHROID, LEVOTHROID) 100 MCG tablet TAKE 1 TABLET BY MOUTH DAILY 60 tablet 0   No  current facility-administered medications on file prior to visit.     BP 117/71 (BP Location: Left Arm, Patient Position: Sitting, Cuff Size: Large)   Pulse 75   Temp 97.9 F (36.6 C) (Oral)   Resp 16   Ht  (1.803 m)   Wt 216 lb (98 kg)   SpO2 99%   BMI 30.13 kg/m       Objective:   Physical Exam  General Mental Status- Alert. General Appearance- Not in acute distress.     HEENT Head- Normal. Ear Auditory Canal - Left- Normal. Right - Normal.Tympanic Membrane- Left- Normal. Right- Normal. Eye Sclera/Conjunctiva- Left- Normal. Right- Normal. Nose &  Sinuses Nasal Mucosa- Left-  Boggy and Congested. Right-  Boggy and  Congested.Bilateral maxillary and frontal sinus pressure. Mouth & Throat Lips: Upper Lip- Normal: no dryness, cracking, pallor, cyanosis, or vesicular eruption. Lower Lip-Normal: no dryness, cracking, pallor, cyanosis or vesicular eruption. Buccal Mucosa- Bilateral- No Aphthous ulcers. Oropharynx- No Discharge or Erythema. Tonsils: Characteristics- Bilateral- No Erythema or Congestion. Size/Enlargement- Bilateral- No enlargement. Discharge- bilateral-None.   Skin General: Color- Normal Color. Moisture- Normal Moisture.  Neck Carotid Arteries- Normal color. Moisture- Normal Moisture. No carotid bruits. No JVD.  Chest and Lung Exam Auscultation: Breath Sounds:-Normal.  Cardiovascular Auscultation:Rythm- Regular. Murmurs & Other Heart Sounds:Auscultation of the heart reveals- No Murmurs.  Abdomen Inspection:-Inspeection Normal. Palpation/Percussion:Note:No mass. Palpation and Percussion of the abdomen reveal- Non Tender, Non Distended + BS, no rebound or guarding.    Neurologic Cranial Nerve exam:- CN III-XII intact(No nystagmus), symmetric smile. Finger to Nose:- Normal/Intact Strength:- 5/5 equal and symmetric strength both upper and lower extremities.      Assessment & Plan:  Dizziness is recently  much improved. But will follow this closely and see if returns. Reassuring that prior mri negative.  Will get labs today to evaluate your chronic conditions low thyroid, low cholesterol and work up mild fatigue. Get cbc,cmp, tsh, b12, and vit d level.  For mild occasional blurred vision see optometrist who you saw last. For eye itching rx patanolol.  Mild exercise daily. When dizzy sensation you might check you blood sugar. You mentioned this as a cause. This might be helpful to know. Generally sugar below 70 could cause dizziness. Over 180 might cause dizziness. Relion glucometer otc reasanably priced. Check  bp and pulse also when dizzy. Note position when dizzy or if occurs on turning head.  Follow follow up  1 month or as needed  May try to rx non generic synthroid as you may feel better overall with this.  Follow cholesterol level and may need to restart med.  Melaine Mcphee, Ramon Dredge, PA-C

## 2016-12-18 ENCOUNTER — Telehealth: Payer: Self-pay | Admitting: Medical

## 2016-12-18 MED ORDER — LEVOTHYROXINE SODIUM 100 MCG PO TABS: 100.0000 ug | ORAL_TABLET | Freq: Every day | ORAL | 3 refills | Status: DC

## 2016-12-18 NOTE — Telephone Encounter (Signed)
rx brand synthroid written. Pharmacy ran and states covered by insurance.

## 2016-12-20 ENCOUNTER — Other Ambulatory Visit: Payer: Self-pay | Admitting: Medical

## 2016-12-20 LAB — VITAMIN D 1,25 DIHYDROXY
Vitamin D 1, 25 (OH)2 Total: 51 pg/mL (ref 18–72)
Vitamin D2 1, 25 (OH)2: 14 pg/mL
Vitamin D3 1, 25 (OH)2: 37 pg/mL

## 2016-12-21 ENCOUNTER — Encounter (HOSPITAL_BASED_OUTPATIENT_CLINIC_OR_DEPARTMENT_OTHER): Payer: Self-pay | Admitting: *Deleted

## 2016-12-21 ENCOUNTER — Emergency Department (HOSPITAL_BASED_OUTPATIENT_CLINIC_OR_DEPARTMENT_OTHER)
Admission: EM | Admit: 2016-12-21 | Discharge: 2016-12-21 | Disposition: A | Discharge status: Home / Self Care | Payer: PPO | Attending: Emergency Medicine | Admitting: Emergency Medicine

## 2016-12-21 DIAGNOSIS — R112 Nausea with vomiting, unspecified: Secondary | ICD-10-CM | POA: Insufficient documentation

## 2016-12-21 DIAGNOSIS — R1084 Generalized abdominal pain: Secondary | ICD-10-CM | POA: Insufficient documentation

## 2016-12-21 DIAGNOSIS — R197 Diarrhea, unspecified: Secondary | ICD-10-CM | POA: Insufficient documentation

## 2016-12-21 DIAGNOSIS — R011 Cardiac murmur, unspecified: Secondary | ICD-10-CM | POA: Insufficient documentation

## 2016-12-21 DIAGNOSIS — Z79899 Other long term (current) drug therapy: Secondary | ICD-10-CM | POA: Insufficient documentation

## 2016-12-21 HISTORY — DX: Disorder of thyroid, unspecified: E07.9

## 2016-12-21 HISTORY — DX: Pure hypercholesterolemia, unspecified: E78.00

## 2016-12-21 LAB — CBC WITH DIFFERENTIAL/PLATELET
BASOS ABS: 0 10*3/uL (ref 0.0–0.1)
Basophils Relative: 0 %
Eosinophils Absolute: 0 10*3/uL (ref 0.0–0.7)
Eosinophils Relative: 0 %
HEMATOCRIT: 42.2 % (ref 39.0–52.0)
Hemoglobin: 14.8 g/dL (ref 13.0–17.0)
Lymphocytes Relative: 13 %
Lymphs Abs: 0.9 10*3/uL (ref 0.7–4.0)
MCH: 30.3 pg (ref 26.0–34.0)
MCHC: 35.1 g/dL (ref 30.0–36.0)
MCV: 86.3 fL (ref 78.0–100.0)
MONO ABS: 0.6 10*3/uL (ref 0.1–1.0)
Monocytes Relative: 8 %
Neutro Abs: 5.7 10*3/uL (ref 1.7–7.7)
Neutrophils Relative %: 79 %
PLATELETS: 156 10*3/uL (ref 150–400)
RBC: 4.89 MIL/uL (ref 4.22–5.81)
RDW: 13 % (ref 11.5–15.5)
WBC: 7.2 10*3/uL (ref 4.0–10.5)

## 2016-12-21 LAB — COMPREHENSIVE METABOLIC PANEL
ALBUMIN: 4.9 g/dL (ref 3.5–5.0)
ALT: 43 U/L (ref 17–63)
AST: 31 U/L (ref 15–41)
Alkaline Phosphatase: 64 U/L (ref 38–126)
Anion gap: 10 (ref 5–15)
BILIRUBIN TOTAL: 1.4 mg/dL — AB (ref 0.3–1.2)
BUN: 22 mg/dL — ABNORMAL HIGH (ref 6–20)
CO2: 26 mmol/L (ref 22–32)
Calcium: 9.4 mg/dL (ref 8.9–10.3)
Chloride: 101 mmol/L (ref 101–111)
Creatinine, Ser: 0.87 mg/dL (ref 0.61–1.24)
GFR calc Af Amer: >60 mL/min (ref >60)
GFR calc non Af Amer: >60 mL/min (ref >60)
GLUCOSE: 88 mg/dL (ref 65–99)
Potassium: 3.5 mmol/L (ref 3.5–5.1)
SODIUM: 137 mmol/L (ref 135–145)
TOTAL PROTEIN: 8.5 g/dL — AB (ref 6.5–8.1)

## 2016-12-21 LAB — URINALYSIS, ROUTINE W REFLEX MICROSCOPIC
GLUCOSE, UA: NEGATIVE mg/dL
Hgb urine dipstick: NEGATIVE
Ketones, ur: 15 mg/dL — AB
LEUKOCYTES UA: NEGATIVE
Nitrite: NEGATIVE
PROTEIN: 30 mg/dL — AB
Specific Gravity, Urine: 1.036 — ABNORMAL HIGH (ref 1.005–1.030)
pH: 6 (ref 5.0–8.0)

## 2016-12-21 LAB — LIPASE, BLOOD: LIPASE: 18 U/L (ref 11–51)

## 2016-12-21 LAB — URINALYSIS, MICROSCOPIC (REFLEX)

## 2016-12-21 MED ORDER — ONDANSETRON HCL 4 MG/2ML IJ SOLN
4.0000 mg | Freq: Once | INTRAMUSCULAR | Status: AC
  Administered 2016-12-21: 4 mg via INTRAVENOUS
  Filled 2016-12-21: qty 2

## 2016-12-21 MED ORDER — ONDANSETRON 4 MG PO TBDP: 4.0000 mg | ORAL_TABLET | Freq: Three times a day (TID) | ORAL | 0 refills | Status: DC | PRN

## 2016-12-21 MED ORDER — KETOROLAC TROMETHAMINE 30 MG/ML IJ SOLN
30.0000 mg | Freq: Once | INTRAMUSCULAR | Status: AC
  Administered 2016-12-21: 30 mg via INTRAVENOUS
  Filled 2016-12-21: qty 1

## 2016-12-21 MED ORDER — SODIUM CHLORIDE 0.9 % IV BOLUS (SEPSIS)
1000.0000 mL | Freq: Once | INTRAVENOUS | Status: AC
  Administered 2016-12-21: 1000 mL via INTRAVENOUS

## 2016-12-21 NOTE — ED Triage Notes (Signed)
Pt reports abd pain radiating to back since yesterday. Also reports n/v/d. Reports wife has something similar symptoms. Pt alert, oriented, NAD at this time.

## 2016-12-21 NOTE — ED Provider Notes (Signed)
[facility] Provider Note   CSN: [id number] Arrival date & time: 12/21/16  1514  By signing my name below, I, Rosario AdieWilliam Andrew Hiatt, attest that this documentation has been prepared under the direction and in the presence of Little, Ambrose Finlandachel Morgan, MD. Electronically Signed: Rosario AdieWilliam Andrew Hiatt, ED Scribe. 12/21/16. 5:38 PM.  History   Chief Complaint Chief Complaint  Patient presents with  . Emesis   The history is provided by the patient. No language interpreter was used.    HPI Comments: Timothy Austin is a 31 y.o. male with a h/o thyroid disease, who presents to the Emergency Department complaining of constant, aching generalized abdominal pain beginning yesterday. He reports some radiation of pain into his lower back. He notes mild diarrhea, nausea, vomiting, loss of appetite, mild dysuria, subjective fever, and generalized arthralgias/myalgias. He has been treating his symptoms with Ibuprofen at home with temporary relief of his sensation of fever but not his other symptoms. His abdominal pain is worse post-prandially. No recent travel. His wife has recently been sick with similar symptoms; however, she has been improving. He denies hematochezia, melena, cough, shortness of breath, sore throat, or any other associated symptoms.   Past Medical History:  Diagnosis Date  . Hypercholesteremia   . Osteochondritis dissecans of left knee   . Thyroid disease   . Wears glasses    Patient Active Problem List   Diagnosis Date Noted  . Wellness examination 09/21/2015  . S/P left knee arthroscopy 03/06/2015   Past Surgical History:  Procedure Laterality Date  . APPENDECTOMY  age 31  . CHONDROPLASTY Left 03/06/2015   Procedure: CHONDROPLASTY;  Surgeon: Eugenia Mcalpineobert Collins, MD;  [facility] [place];  Service: Orthopedics;  Laterality: Left;  . KNEE ARTHROSCOPY Left 03/06/2015   Procedure: ARTHROSCOPY KNEE DEBRIDEMENT  of OCD LESION  AND HARVEST CARTILAGE;  Surgeon:  Eugenia Mcalpineobert Collins, MD;  [facility] [place];  Service: Orthopedics;  Laterality: Left;    Home Medications    Prior to Admission medications   Medication Sig Start Date End Date Taking? Authorizing Provider  levothyroxine (SYNTHROID) 100 MCG tablet Take 1 tablet (100 mcg total) by mouth daily before breakfast. Dispense as written brand synthroid. 12/18/16  Yes Saguier, Ramon DredgeEdward, PA-C  atorvastatin (LIPITOR) 10 MG tablet Take 1 tablet (10 mg total) by mouth daily. 07/09/16   Saguier, Ramon DredgeEdward, PA-C  ondansetron (ZOFRAN ODT) 4 MG disintegrating tablet Take 1 tablet (4 mg total) by mouth every 8 (eight) hours as needed for nausea or vomiting. 12/21/16   Little, Ambrose Finlandachel Morgan, MD   Family History Family History  Problem Relation Age of Onset  . Hypertension Mother   . Diabetes Mother   . Hypertension Father   . Diabetes Father    Social History Social History  Substance Use Topics  . Smoking status: Never Smoker  . Smokeless tobacco: Never Used  . Alcohol use No   Allergies   Patient has no known allergies.  Review of Systems Review of Systems A complete review of systems was obtained and all systems are negative except as noted in the HPI and PMH.   Physical Exam Updated Vital Signs BP 113/74 (BP Location: Right Arm)   Pulse 90   Temp 99.9 F (37.7 C) (Oral)   Resp 16   Ht 5\' 11"  (1.803 m)   Wt 216 lb (98 kg)   SpO2 98%   BMI 30.13 kg/m   Physical Exam  Constitutional: He is oriented to person, place,  and time. He appears well-developed and well-nourished. No distress.  HENT:  Head: Normocephalic and atraumatic.  Moist mucous membranes. Mild erythema of posterior oropharynx. Uvula midline. No tonsillar swelling.   Eyes: Conjunctivae are normal. Pupils are equal, round, and reactive to light.  Neck: Neck supple.  Cardiovascular: Normal rate and regular rhythm.   Murmur heard.  Systolic murmur is present with a grade of 2/6  Pulmonary/Chest: Effort normal and  breath sounds normal.  Abdominal: Soft. Bowel sounds are normal. He exhibits no distension. There is no tenderness.  Musculoskeletal: He exhibits no edema.  No spinal tenderness or paraspinal tenderness.   Neurological: He is alert and oriented to person, place, and time.  Fluent speech  Skin: Skin is warm and dry. No rash noted.  Psychiatric: He has a normal mood and affect. Judgment normal.  Nursing note and vitals reviewed.  ED Treatments / Results  DIAGNOSTIC STUDIES: Oxygen Saturation is 99% on RA, normal by my interpretation.   COORDINATION OF CARE: 5:37 PM-Discussed next steps with pt. Pt verbalized understanding and is agreeable with the plan.   Labs (all labs ordered are listed, but only abnormal results are displayed) Labs Reviewed  URINALYSIS, ROUTINE W REFLEX MICROSCOPIC - Abnormal; Notable for the following:       Result Value   Color, Urine AMBER (*)    Specific Gravity, Urine 1.036 (*)    Bilirubin Urine SMALL (*)    Ketones, ur 15 (*)    Protein, ur 30 (*)    All other components within normal limits  COMPREHENSIVE METABOLIC PANEL - Abnormal; Notable for the following:    BUN 22 (*)    Total Protein 8.5 (*)    Total Bilirubin 1.4 (*)    All other components within normal limits  URINALYSIS, MICROSCOPIC (REFLEX) - Abnormal; Notable for the following:    Bacteria, UA FEW (*)    Squamous Epithelial / LPF 0-5 (*)    All other components within normal limits  CBC WITH DIFFERENTIAL/PLATELET  LIPASE, BLOOD   EKG  EKG Interpretation None      Radiology No results found.  Procedures Procedures   Medications Ordered in ED Medications  sodium chloride 0.9 % bolus 1,000 mL (0 mLs Intravenous Stopped 12/21/16 1819)  ondansetron (ZOFRAN) injection 4 mg (4 mg Intravenous Given 12/21/16 1814)  ketorolac (TORADOL) 30 MG/ML injection 30 mg (30 mg Intravenous Given 12/21/16 1815)   Initial Impression / Assessment and Plan / ED Course  I have reviewed the triage  vital signs and the nursing notes.  Pertinent labs that were available during my care of the patient were reviewed by me and considered in my medical decision making (see chart for details).    PT w/ Generalized abdominal pain associated with vomiting, loose stools, body aches, and subjective fevers. He was nontoxic on exam with reassuring vital signs. Temperature 99.9. He had no abdominal tenderness on exam. Obtained above labs and gave the patient an IV fluid bolus, Toradol, and Zofran.  Labwork shows mild dehydration on UA but unremarkable CBC and CMP. Patient well-appearing on reexamination and has tolerated a drink and crackers with no problems.  His symptoms are consistent with a viral process. Given that he has a normal abdominal exam, reassuring labs, and is tolerating by mouth here, I feel he is safe for discharge. I have discussed supportive measures and extensively reviewed return precautions including worsening pain, intractable vomiting, bloody stools. Patient voiced understanding and was discharged in satisfactory condition. Final  Clinical Impressions(s) / ED Diagnoses   Final diagnoses:  Nausea vomiting and diarrhea  Generalized abdominal pain   New Prescriptions New Prescriptions   ONDANSETRON (ZOFRAN ODT) 4 MG DISINTEGRATING TABLET    Take 1 tablet (4 mg total) by mouth every 8 (eight) hours as needed for nausea or vomiting.   I personally performed the services described in this documentation, which was scribed in my presence. The recorded information has been reviewed and is accurate.     Little, Ambrose Finland, MD 12/21/16 2056

## 2016-12-21 NOTE — ED Notes (Signed)
EMT went into room to ask pt to provide urine sample. Pt not in room at this time. Will attempt later.

## 2016-12-23 ENCOUNTER — Encounter: Payer: Self-pay | Admitting: Medical

## 2016-12-24 ENCOUNTER — Encounter: Payer: Self-pay | Admitting: Neurology

## 2016-12-24 ENCOUNTER — Ambulatory Visit (INDEPENDENT_AMBULATORY_CARE_PROVIDER_SITE_OTHER): Payer: PPO | Admitting: Neurology

## 2016-12-24 VITALS — BP 100/62 | HR 79 | Temp 97.9°F | Ht 71.0 in | Wt 214.0 lb

## 2016-12-24 DIAGNOSIS — R42 Dizziness and giddiness: Secondary | ICD-10-CM

## 2016-12-24 NOTE — Progress Notes (Signed)
NEUROLOGY FOLLOW UP OFFICE NOTE  Muhamed Luecke [id number]  HISTORY OF PRESENT ILLNESS: Timothy Austin is a 31 year old right-handed male who follows up for dizziness.  UPDATE: He didn't have an MRI performed for almost a year.  MRI of brain without contrast from 09/16/16 was personally reviewed and was normal.  Thyroid panel from 12/17/16, including TSH, free T3 and T4, were normal.  B12 level was 289 and he was advised to start over the counter supplementation.  He has followed up with ENT.  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    Symptoms improved but then increased over the past couple of weeks.  There are no specific triggers.  However, neck exercise helps.  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  He sleeps well.  He drinks about 1 liter of water daily.  He drinks 2 medium size cups of coffee daily.  HISTORY: Since 2016, he has experienced dizzy spells on and off.  He describes it as having blurred vision with a sensation of feeling like he has been sleep-deprived.  There is no spinning sensation, lightheadedness or feeling of passing out.  He also reports otalgia in the left ear.  There is no associated headache, nausea or ataxia.  It often will last a couple of hours up to a day or two.   He was evaluated by ENT.  Physical exam and VNG was normal. He has no prior history of migraines.  His mother has similar spells of dizziness.  PAST MEDICAL HISTORY: Past Medical History:  Diagnosis Date  . Hypercholesteremia   . Osteochondritis dissecans of left knee   . Thyroid disease   . Wears glasses     MEDICATIONS: Current Outpatient Prescriptions on File Prior to Visit  Medication Sig Dispense Refill  . levothyroxine (SYNTHROID) 100 MCG tablet Take 1 tablet (100 mcg total) by mouth daily before breakfast. Dispense as written brand synthroid. 30 tablet 3   No current facility-administered  medications on file prior to visit.     ALLERGIES: No Known Allergies  FAMILY HISTORY: Family History  Problem Relation Age of Onset  . Hypertension Mother   . Diabetes Mother   . Hypertension Father   . Diabetes Father     SOCIAL HISTORY: Social History   Social History  . Marital status: Married    Spouse name: N/A  . Number of children: N/A  . Years of education: N/A   Occupational History  . Not on file.   Social History Main Topics  . Smoking status: Never Smoker  . Smokeless tobacco: Never Used  . Alcohol use No  . Drug use: No  . Sexual activity: Yes   Other Topics Concern  . Not on file   Social History Narrative  . No narrative on file    REVIEW OF SYSTEMS: Constitutional: No fevers, chills, or sweats, no generalized fatigue, change in appetite Eyes: No visual changes, double vision, eye pain Ear, nose and throat: No hearing loss, ear pain, nasal congestion, sore throat Cardiovascular: No chest pain, palpitations Respiratory:  No shortness of breath at rest or with exertion, wheezes GastrointestinaI: No nausea, vomiting, diarrhea, abdominal pain, fecal incontinence Genitourinary:  No dysuria, urinary retention or frequency Musculoskeletal:  No neck pain, back pain Integumentary: No rash, pruritus, skin lesions Neurological: as above Psychiatric: No depression, insomnia, anxiety Endocrine: No palpitations, fatigue, diaphoresis, mood swings, change in appetite,  change in weight, increased thirst Hematologic/Lymphatic:  No purpura, petechiae. Allergic/Immunologic: no itchy/runny eyes, nasal congestion, recent allergic reactions, rashes  PHYSICAL EXAM: Vitals:   12/24/16 0902  BP: 100/62  Pulse: 79  Temp: 97.9 F (36.6 C)   General: No acute distress.  Patient appears well-groomed.  normal body habitus. Head:  Normocephalic/atraumatic Eyes:  Fundi examined but not visualized Neck: supple, no paraspinal tenderness, full range of motion Heart:   Regular rate and rhythm Lungs:  Clear to auscultation bilaterally Back: No paraspinal tenderness Neurological Exam: alert and oriented to person, place, and time. Attention span and concentration intact, recent and remote memory intact, fund of knowledge intact.  Speech fluent and not dysarthric, language intact.  CN II-XII intact. Bulk and tone normal, muscle strength 5/5 throughout.  Sensation to light touch, temperature and vibration intact.  Deep tendon reflexes 2+ throughout, toes downgoing.  Finger to nose and heel to shin testing intact.  Gait normal, Romberg negative.  IMPRESSION: Subjective vague dizziness.  It is mostly episodes of unfocused vision.  Unclear etiology.  Possibly secondary to stress.  Neurologic exam is non-focal.  It does not appear neurologic.  PLAN: No further neurologic workup warranted.    15 minutes spent face to face with patient, over 50% spent discussing my findings and assessment.  Shon MilletAdam Jaffe, DO  CC: Esperanza RichtersEdward Saguier, PA-C

## 2016-12-24 NOTE — Patient Instructions (Signed)
Follow up as needed

## 2016-12-31 ENCOUNTER — Ambulatory Visit: Payer: PPO | Admitting: Medical

## 2017-02-11 ENCOUNTER — Ambulatory Visit: Payer: PPO | Admitting: Medical

## 2017-02-17 ENCOUNTER — Encounter: Payer: Self-pay | Admitting: Medical

## 2017-02-17 ENCOUNTER — Ambulatory Visit (INDEPENDENT_AMBULATORY_CARE_PROVIDER_SITE_OTHER): Payer: PPO | Admitting: Medical

## 2017-02-17 VITALS — BP 119/70 | HR 78 | Temp 98.3°F | Resp 16 | Ht 72.0 in | Wt 216.4 lb

## 2017-02-17 DIAGNOSIS — H538 Other visual disturbances: Secondary | ICD-10-CM

## 2017-02-17 DIAGNOSIS — R42 Dizziness and giddiness: Secondary | ICD-10-CM

## 2017-02-17 DIAGNOSIS — R41840 Attention and concentration deficit: Secondary | ICD-10-CM

## 2017-02-17 NOTE — Patient Instructions (Addendum)
The ENT and neurologist did not find cause for dizziness. I am relieved that some of symptoms have diminished and glad to hear you have resumed your studies.  I do think it may be worthwhile to get ophthalmologist opinion on your persisting intermittent blurred vision.  Also I will refer for ADD testing in light of your concentration difficulty and moderate high self report scale test score today.  Follow up in 6 weeks or as needed

## 2017-02-17 NOTE — Progress Notes (Signed)
Subjective:    Patient ID: Timothy Austin, male    DOB: 10-25-1985, 31 y.o.   MRN: [id number]  HPI  Pt in for follow up.  Pt has dizziness history. Neurologist saw patient and did not determine cause. Ent did not determine cause either.  Pt did check his sugars as I advised but his sugars were close to 90 each time.  Pt states at times when he reads and attends lectures he states has hard time reading like he can't focus. Pt has seen optometrist in past 6-7 months. His prescription was changed. Pt wonders if he needs eye pressures checked but he has no eye pain. No light flashing, no spots in visual field and no floaters.  Pt states sometimes recently he feels like when he reads he just can't concentrate on material. He is not comprehending what he is reading. Seems not to process what instructors are saying.   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     Review of Systems  Constitutional: Negative for chills, fatigue and fever.  Eyes:       Mild blurred vision when reads. But less than before.  Respiratory: Negative for cough, chest tightness, shortness of breath and wheezing.   Cardiovascular: Negative for chest pain and palpitations.  Gastrointestinal: Negative for abdominal pain.  Musculoskeletal: Negative for back pain.  Skin: Negative for rash.  Neurological: Positive for dizziness. Negative for tremors, seizures, weakness, numbness and headaches.       Occasional dizziness. But less now than in past.  Hematological: Negative for adenopathy. Does not bruise/bleed easily.  Psychiatric/Behavioral: Positive for decreased concentration. Negative for agitation, self-injury, sleep disturbance and suicidal ideas. The patient is not nervous/anxious.        Some stress related to studies.    Past Medical History:    Diagnosis Date  . Hypercholesteremia   . Osteochondritis dissecans of left knee   . Thyroid disease   . Wears glasses      Social History   Social History  . Marital status: Married    Spouse name: N/A  . Number of children: N/A  . Years of education: N/A   Occupational History  . Not on file.   Social History Main Topics  . Smoking status: Never Smoker  . Smokeless tobacco: Never Used  . Alcohol use No  . Drug use: No  . Sexual activity: Yes   Other Topics Concern  . Not on file   Social History Narrative  . No narrative on file    Past Surgical History:  Procedure Laterality Date  . APPENDECTOMY  age 31  . CHONDROPLASTY Left 03/06/2015   Procedure: CHONDROPLASTY;  Surgeon: Eugenia Mcalpineobert Collins, MD;  [facility] [place];  Service: Orthopedics;  Laterality: Left;  . KNEE ARTHROSCOPY Left 03/06/2015   Procedure: ARTHROSCOPY KNEE DEBRIDEMENT  of OCD LESION  AND HARVEST CARTILAGE;  Surgeon: Eugenia Mcalpineobert Collins, MD;  [facility] [place];  Service: Orthopedics;  Laterality: Left;    Family History  Problem Relation Age of Onset  . Hypertension Mother   . Diabetes Mother   . Hypertension Father   . Diabetes Father     No Known Allergies  Current Outpatient Prescriptions on File Prior to Visit  Medication Sig Dispense Refill  . levothyroxine (SYNTHROID) 100 MCG tablet Take 1 tablet (100  mcg total) by mouth daily before breakfast. Dispense as written brand synthroid. 30 tablet 3   No current facility-administered medications on file prior to visit.     BP 119/70 (BP Location: Left Arm, Patient Position: Sitting, Cuff Size: Normal)   Pulse 78   Temp 98.3 F (36.8 C) (Oral)   Resp 16   Ht 6' (1.829 m)   Wt 216 lb 6.4 oz (98.2 kg)   SpO2 96%   BMI 29.35 kg/m       Objective:   Physical Exam  General Mental Status- Alert. General Appearance- Not in acute distress.   Skin General: Color- Normal Color. Moisture- Normal  Moisture.  Neck Carotid Arteries- Normal color. Moisture- Normal Moisture. No carotid bruits. No JVD.  Chest and Lung Exam Auscultation: Breath Sounds:-Normal.  Cardiovascular Auscultation:Rythm- Regular. Murmurs & Other Heart Sounds:Auscultation of the heart reveals- No Murmurs.    Neurologic Cranial Nerve exam:- CN III-XII intact(No nystagmus), symmetric smile. Strength:- 5/5 equal and symmetric strength both upper and lower extremities.     Assessment & Plan:  The ENT and neurologist did not find cause for dizziness. I am relieved that some of symptoms have diminished and glad to hear you have resumed your studies.  I do think it may be worthwhile to get ophthalmologist opinion on your persisting intermittent blurred vision.  Also I will refer for ADD testing in light of your concentration difficulty and moderate high self report scale test score today.  Follow up in 6 weeks or as needed  Teddi Badalamenti, Ramon Dredge, [employer]

## 2017-03-04 ENCOUNTER — Encounter (HOSPITAL_BASED_OUTPATIENT_CLINIC_OR_DEPARTMENT_OTHER): Payer: Self-pay

## 2017-03-04 ENCOUNTER — Emergency Department (HOSPITAL_BASED_OUTPATIENT_CLINIC_OR_DEPARTMENT_OTHER): Payer: PPO

## 2017-03-04 ENCOUNTER — Emergency Department (HOSPITAL_BASED_OUTPATIENT_CLINIC_OR_DEPARTMENT_OTHER)
Admission: EM | Admit: 2017-03-04 | Discharge: 2017-03-04 | Disposition: A | Discharge status: Home / Self Care | Payer: PPO | Attending: Emergency Medicine | Admitting: Emergency Medicine

## 2017-03-04 DIAGNOSIS — Z79899 Other long term (current) drug therapy: Secondary | ICD-10-CM | POA: Diagnosis not present

## 2017-03-04 DIAGNOSIS — R079 Chest pain, unspecified: Secondary | ICD-10-CM | POA: Diagnosis present

## 2017-03-04 DIAGNOSIS — K30 Functional dyspepsia: Secondary | ICD-10-CM | POA: Insufficient documentation

## 2017-03-04 DIAGNOSIS — R0789 Other chest pain: Secondary | ICD-10-CM | POA: Diagnosis not present

## 2017-03-04 HISTORY — DX: Dizziness and giddiness: R42

## 2017-03-04 LAB — CBC WITH DIFFERENTIAL/PLATELET
BASOS ABS: 0 10*3/uL (ref 0.0–0.1)
Basophils Relative: 0 %
EOS PCT: 2 %
Eosinophils Absolute: 0.2 10*3/uL (ref 0.0–0.7)
HEMATOCRIT: 41.2 % (ref 39.0–52.0)
Hemoglobin: 14.2 g/dL (ref 13.0–17.0)
Lymphocytes Relative: 32 %
Lymphs Abs: 2.4 10*3/uL (ref 0.7–4.0)
MCH: 30.1 pg (ref 26.0–34.0)
MCHC: 34.5 g/dL (ref 30.0–36.0)
MCV: 87.5 fL (ref 78.0–100.0)
MONO ABS: 0.6 10*3/uL (ref 0.1–1.0)
Monocytes Relative: 9 %
NEUTROS ABS: 4.1 10*3/uL (ref 1.7–7.7)
Neutrophils Relative %: 57 %
PLATELETS: 217 10*3/uL (ref 150–400)
RBC: 4.71 MIL/uL (ref 4.22–5.81)
RDW: 12.6 % (ref 11.5–15.5)
WBC: 7.3 10*3/uL (ref 4.0–10.5)

## 2017-03-04 LAB — COMPREHENSIVE METABOLIC PANEL
ALT: 63 U/L (ref 17–63)
AST: 31 U/L (ref 15–41)
Albumin: 4.6 g/dL (ref 3.5–5.0)
Alkaline Phosphatase: 58 U/L (ref 38–126)
Anion gap: 9 (ref 5–15)
BUN: 19 mg/dL (ref 6–20)
CHLORIDE: 103 mmol/L (ref 101–111)
CO2: 28 mmol/L (ref 22–32)
Calcium: 9.5 mg/dL (ref 8.9–10.3)
Creatinine, Ser: 0.91 mg/dL (ref 0.61–1.24)
GFR calc Af Amer: >60 mL/min (ref >60)
GFR calc non Af Amer: >60 mL/min (ref >60)
GLUCOSE: 100 mg/dL — AB (ref 65–99)
POTASSIUM: 4.5 mmol/L (ref 3.5–5.1)
Sodium: 140 mmol/L (ref 135–145)
Total Bilirubin: 0.4 mg/dL (ref 0.3–1.2)
Total Protein: 8.4 g/dL — ABNORMAL HIGH (ref 6.5–8.1)

## 2017-03-04 LAB — TROPONIN I: Troponin I: <0.03 ng/mL (ref <0.03)

## 2017-03-04 LAB — LIPASE, BLOOD: Lipase: 34 U/L (ref 11–51)

## 2017-03-04 MED ORDER — GI COCKTAIL ~~LOC~~
30.0000 mL | Freq: Once | ORAL | Status: AC
  Administered 2017-03-04: 30 mL via ORAL
  Filled 2017-03-04: qty 30

## 2017-03-04 MED ORDER — ALUM & MAG HYDROXIDE-SIMETH 400-400-40 MG/5ML PO SUSP: 10.0000 mL | Freq: Four times a day (QID) | ORAL | 0 refills | Status: DC | PRN

## 2017-03-04 MED ORDER — OMEPRAZOLE 20 MG PO CPDR: 20.0000 mg | DELAYED_RELEASE_CAPSULE | Freq: Every day | ORAL | 2 refills | Status: DC

## 2017-03-04 NOTE — Discharge Instructions (Signed)
Your chest x-ray and blood work are reassuring.   Your symptoms seem consistent with likely indigestion. Please take medications as prescribed.  Return without fail for worsening symptoms, including fever, intractable vomiting,  severe abdominal pain, difficulty breathing, or any other symptoms concerning to you.

## 2017-03-04 NOTE — ED Provider Notes (Signed)
[facility] Provider Note   CSN: [id number] Arrival date & time: 03/04/17  1921   By signing my name below, I, Timothy Austin, attest that this documentation has been prepared under the direction and in the presence of Verdie Austin, Timothy Bonito, MD. Electronically signed, Timothy Austin, ED Scribe. 03/04/17. 9:03 PM.  History   Chief Complaint Chief Complaint  Patient presents with  . Chest Pain   The history is provided by the patient and medical records. No language interpreter was used.    Timothy Austin is a 31 y.o. male with h/o thyroid disease and hypercholestrolemia presenting to the Emergency Department concerning 3 days of intermittent central chest pain. Intermittent SOB last night also noted. He states N/V, burning pressure-like needle chest pain and LUQ pain began today. 4/10 pain described. Intermittent chest pain associated with certain positions, especially at night in bed. Pt alleges he took tylenol at home. No exacerbation to pain with activity. Pt states he ate fruit before bed last night. No fever, diarrhea, dysuria, frequency, cough. No other complaints at this time.   Past Medical History:  Diagnosis Date  . Dizziness   . Hypercholesteremia   . Osteochondritis dissecans of left knee   . Thyroid disease   . Wears glasses     Patient Active Problem List   Diagnosis Date Noted  . Wellness examination 09/21/2015  . S/P left knee arthroscopy 03/06/2015    Past Surgical History:  Procedure Laterality Date  . APPENDECTOMY  age 11  . CHONDROPLASTY Left 03/06/2015   Procedure: CHONDROPLASTY;  Surgeon: Eugenia Mcalpine, MD;  Location: [facility];  Service: Orthopedics;  Laterality: Left;  . KNEE ARTHROSCOPY Left 03/06/2015   Procedure: ARTHROSCOPY KNEE DEBRIDEMENT  of OCD LESION  AND HARVEST CARTILAGE;  Surgeon: Eugenia Mcalpine, MD;  Location: [facility];  Service: Orthopedics;  Laterality: Left;       Home Medications    Prior  to Admission medications   Medication Sig Start Date End Date Taking? Authorizing Provider  alum & mag hydroxide-simeth (MAALOX ADVANCED MAX ST) 400-400-40 MG/5ML suspension Take 10 mLs by mouth every 6 (six) hours as needed for indigestion. 03/04/17   Lavera Guise, MD  levothyroxine (SYNTHROID) 100 MCG tablet Take 1 tablet (100 mcg total) by mouth daily before breakfast. Dispense as written brand synthroid. 12/18/16   Saguier, Ramon Dredge, PA-C  omeprazole (PRILOSEC) 20 MG capsule Take 1 capsule (20 mg total) by mouth daily. 03/04/17   Lavera Guise, MD    Family History Family History  Problem Relation Age of Onset  . Hypertension Mother   . Diabetes Mother   . Hypertension Father   . Diabetes Father     Social History Social History  Substance Use Topics  . Smoking status: Never Smoker  . Smokeless tobacco: Never Used  . Alcohol use No     Allergies   Patient has no known allergies.   Review of Systems Review of Systems  Constitutional: Negative for fever.  Respiratory: Positive for shortness of breath. Negative for cough.   Cardiovascular: Positive for chest pain.  Gastrointestinal: Positive for abdominal pain, nausea and vomiting. Negative for diarrhea.  Genitourinary: Negative for dysuria and frequency.  All other systems reviewed and are negative.    Physical Exam Updated Vital Signs BP 121/76   Pulse 70   Temp 98.2 F (36.8 C) (Oral)   Resp 18   Ht 5\' 9"  (1.753 m)   Wt 217 lb (98.4 kg)  SpO2 96%   BMI 32.05 kg/m   Physical Exam Physical Exam  Nursing note and vitals reviewed. Constitutional: Well developed, well nourished, non-toxic, and in no acute distress Head: Normocephalic and atraumatic.  Mouth/Throat: Oropharynx is clear and moist.  Neck: Normal range of motion. Neck supple.  Cardiovascular: Normal rate and regular rhythm.   Pulmonary/Chest: Effort normal and breath sounds normal.  Abdominal: Soft. There is no tenderness. There is no rebound and  no guarding. Mild epigastric pain.  Musculoskeletal: Normal range of motion. No lower extremity edema or calf tenderness.  Neurological: Alert, no facial droop, fluent speech, moves all extremities symmetrically Skin: Skin is warm and dry.  Psychiatric: Cooperative   ED Treatments / Results  DIAGNOSTIC STUDIES: Oxygen Saturation is 96% on RA, adequate by my interpretation.    COORDINATION OF CARE: 8:46 PM-Discussed next steps with pt. Pt verbalized understanding and is agreeable with the plan. Will order blood work.   Labs (all labs ordered are listed, but only abnormal results are displayed) Labs Reviewed  COMPREHENSIVE METABOLIC PANEL - Abnormal; Notable for the following:       Result Value   Glucose, Bld 100 (*)    Total Protein 8.4 (*)    All other components within normal limits  CBC WITH DIFFERENTIAL/PLATELET  TROPONIN I  LIPASE, BLOOD    EKG  EKG Interpretation  Date/Time:  Wednesday March 04 2017 19:30:44 EDT Ventricular Rate:  75 PR Interval:  152 QRS Duration: 84 QT Interval:  352 QTC Calculation: 393 R Axis:   31 Text Interpretation:  Normal sinus rhythm Normal ECG normal EKG no prior  Confirmed by Crista CurbLiu, Dana [id number]) on 03/04/2017 8:19:44 PM       Radiology Dg Chest 2 View  Result Date: 03/04/2017 CLINICAL DATA:  Chest pain EXAM: CHEST  2 VIEW COMPARISON:  04/27/2016 FINDINGS: The heart size and mediastinal contours are within normal limits. Both lungs are clear. The visualized skeletal structures are unremarkable. IMPRESSION: No active cardiopulmonary disease. Electronically Signed   By: Marlan Palauharles  Clark M.D.   On: 03/04/2017 19:53    Procedures Procedures (including critical care time)  Medications Ordered in ED Medications  gi cocktail (Maalox,Lidocaine,Donnatal) (30 mLs Oral Given 03/04/17 2059)     Initial Impression / Assessment and Plan / ED Course  I have reviewed the triage vital signs and the nursing notes.  Pertinent labs & imaging results  that were available during my care of the patient were reviewed by me and considered in my medical decision making (see chart for details).     31 year old male who presents with atypical chest pain. He is well-appearing acute distress with normal vitals. Pain does not seem cardiac in nature. Suspect may be indigestion or reflux related pain as it seems to be associated with epigastric abdominal discomfort, and worse at night when he is trying to sleep.  His EKG is not ischemic and without concerning features. Chest x-ray visualized and shows no acute cardio pulmonary processes. Troponin 1 is normal. Not suspicious for ACS, dissection, PE or other serious intrathoracic or cardiopulmonary etiologies.  LFTs and normal lipase. Given GI cocktail, with improvement in his symptoms. Started on PPI and Maalox for symptoms. Strict return and follow-up instructions reviewed. He expressed understanding of all discharge instructions and felt comfortable with the plan of care.   Final Clinical Impressions(s) / ED Diagnoses   Final diagnoses:  Indigestion  Atypical chest pain    New Prescriptions New Prescriptions   ALUM & MAG  HYDROXIDE-SIMETH (MAALOX ADVANCED MAX ST) 400-400-40 MG/5ML SUSPENSION    Take 10 mLs by mouth every 6 (six) hours as needed for indigestion.   OMEPRAZOLE (PRILOSEC) 20 MG CAPSULE    Take 1 capsule (20 mg total) by mouth daily.   I personally performed the services described in this documentation, which was scribed in my presence. The recorded information has been reviewed and is accurate.    Lavera Guise, MD 03/04/17 2124

## 2017-03-04 NOTE — ED Triage Notes (Signed)
C/o CP x 3 days-abd pain x today

## 2017-03-04 NOTE — ED Notes (Signed)
Pt verbalizes understanding of d/c instructions and denies any further needs at this time. 

## 2017-03-04 NOTE — ED Notes (Signed)
Patient transported to X-ray 

## 2017-03-04 NOTE — ED Notes (Signed)
Pt walked back to car to get phone after registration process.

## 2017-04-15 ENCOUNTER — Other Ambulatory Visit: Payer: Self-pay | Admitting: Medical

## 2017-06-17 ENCOUNTER — Ambulatory Visit (HOSPITAL_BASED_OUTPATIENT_CLINIC_OR_DEPARTMENT_OTHER)
Admission: RE | Admit: 2017-06-17 | Discharge: 2017-06-17 | Disposition: A | Discharge status: Home / Self Care | Payer: PPO | Source: Ambulatory Visit | Attending: Medical | Admitting: Medical

## 2017-06-17 ENCOUNTER — Encounter: Payer: Self-pay | Admitting: Medical

## 2017-06-17 ENCOUNTER — Ambulatory Visit (INDEPENDENT_AMBULATORY_CARE_PROVIDER_SITE_OTHER): Payer: PPO | Admitting: Medical

## 2017-06-17 VITALS — BP 115/68 | HR 96 | Temp 98.1°F | Resp 16 | Ht 72.0 in | Wt 212.0 lb

## 2017-06-17 DIAGNOSIS — R1012 Left upper quadrant pain: Secondary | ICD-10-CM | POA: Diagnosis not present

## 2017-06-17 DIAGNOSIS — R109 Unspecified abdominal pain: Secondary | ICD-10-CM

## 2017-06-17 DIAGNOSIS — M549 Dorsalgia, unspecified: Secondary | ICD-10-CM

## 2017-06-17 LAB — POC URINALSYSI DIPSTICK (AUTOMATED)
BILIRUBIN UA: NEGATIVE
Glucose, UA: NEGATIVE
Ketones, UA: NEGATIVE
Leukocytes, UA: NEGATIVE
NITRITE UA: NEGATIVE
PH UA: 6 (ref 5.0–8.0)
PROTEIN UA: NEGATIVE
RBC UA: NEGATIVE
Spec Grav, UA: >=1.03 — AB (ref 1.010–1.025)
Urobilinogen, UA: NEGATIVE E.U./dL — AB

## 2017-06-17 LAB — COMPREHENSIVE METABOLIC PANEL
ALT: 52 U/L (ref 0–53)
AST: 31 U/L (ref 0–37)
Albumin: 4.5 g/dL (ref 3.5–5.2)
Alkaline Phosphatase: 57 U/L (ref 39–117)
BUN: 18 mg/dL (ref 6–23)
CO2: 27 meq/L (ref 19–32)
CREATININE: 0.75 mg/dL (ref 0.40–1.50)
Calcium: 9.4 mg/dL (ref 8.4–10.5)
Chloride: 104 mEq/L (ref 96–112)
GFR: 128.33 mL/min (ref >60.00)
GLUCOSE: 87 mg/dL (ref 70–99)
Potassium: 3.8 mEq/L (ref 3.5–5.1)
Sodium: 138 mEq/L (ref 135–145)
Total Bilirubin: 0.6 mg/dL (ref 0.2–1.2)
Total Protein: 7.9 g/dL (ref 6.0–8.3)

## 2017-06-17 LAB — CBC WITH DIFFERENTIAL/PLATELET
Basophils Absolute: 0 10*3/uL (ref 0.0–0.1)
Basophils Relative: 1 % (ref 0.0–3.0)
EOS ABS: 0.1 10*3/uL (ref 0.0–0.7)
Eosinophils Relative: 1.5 % (ref 0.0–5.0)
HCT: 41.2 % (ref 39.0–52.0)
Hemoglobin: 13.8 g/dL (ref 13.0–17.0)
LYMPHS ABS: 2.1 10*3/uL (ref 0.7–4.0)
LYMPHS PCT: 43.6 % (ref 12.0–46.0)
MCHC: 33.5 g/dL (ref 30.0–36.0)
MCV: 88.4 fl (ref 78.0–100.0)
Monocytes Absolute: 0.3 10*3/uL (ref 0.1–1.0)
Monocytes Relative: 7.1 % (ref 3.0–12.0)
NEUTROS PCT: 46.8 % (ref 43.0–77.0)
Neutro Abs: 2.3 10*3/uL (ref 1.4–7.7)
PLATELETS: 175 10*3/uL (ref 150.0–400.0)
RBC: 4.66 Mil/uL (ref 4.22–5.81)
RDW: 12.5 % (ref 11.5–15.5)
WBC: 4.9 10*3/uL (ref 4.0–10.5)

## 2017-06-17 LAB — LIPASE: LIPASE: 11 U/L (ref 11.0–59.0)

## 2017-06-17 LAB — AMYLASE: Amylase: 41 U/L (ref 27–131)

## 2017-06-17 MED ORDER — RANITIDINE HCL 150 MG PO CAPS: 150.0000 mg | ORAL_CAPSULE | Freq: Two times a day (BID) | ORAL | 0 refills | Status: DC

## 2017-06-17 NOTE — Progress Notes (Signed)
Subjective:    Patient ID: Timothy Austin, male    DOB: [birth year], 31 y.o.   MRN: [id number]  HPI  Pt in states his abdomen pain is still persistent. He had abdomen pain since prior visit with me. Appears he told ED on visit several years. He states maybe 6-8 years with feeling of gas/bloated in luq region.  Recently over past month will have loose stools. Maybe 4-6 loose stools a day. But on further clarification he describes soft stool but does not break up when stool hits water. Basically formed. He states goes  He has too frequent bm. Then he states recently he also states 4 days of constipation after drinking green tea. The used maalox and had bowel movement.   In past for his abdomen pain [place] was negative.    Review of Systems  Constitutional: Negative for chills and fatigue.  Respiratory: Negative for cough, chest tightness, shortness of breath and wheezing.   Gastrointestinal: Positive for abdominal distention and abdominal pain. Negative for anal bleeding, blood in stool, constipation, nausea and vomiting.  Musculoskeletal: Positive for back pain.       No pain presently but sometimes pain left upper quadrant that radiates to his kidney area per patient.  Neurological: Negative for dizziness, seizures, speech difficulty, weakness, numbness and headaches.  Hematological: Negative for adenopathy. Does not bruise/bleed easily.  Psychiatric/Behavioral: Negative for behavioral problems, confusion and dysphoric mood. The patient is not nervous/anxious.        Pt states stress from studying but denies depression or need for medication. Pt states stress getting phd and wants to publish papers.   Past Medical History:  Diagnosis Date  . Dizziness   . Hypercholesteremia   . Osteochondritis dissecans of left knee   . Thyroid disease   . Wears glasses      Social History   Social History  . Marital status: Married    Spouse name: N/A  . Number of children: N/A  . Years of  education: N/A   Occupational History  . Not on file.   Social History Main Topics  . Smoking status: Never Smoker  . Smokeless tobacco: Never Used  . Alcohol use No  . Drug use: No  . Sexual activity: Not on file   Other Topics Concern  . Not on file   Social History Narrative  . No narrative on file    Past Surgical History:  Procedure Laterality Date  . APPENDECTOMY  age 80  . CHONDROPLASTY Left 03/06/2015   Procedure: CHONDROPLASTY;  Surgeon: Eugenia Mcalpine, MD;  Location: [facility];  Service: Orthopedics;  Laterality: Left;  . KNEE ARTHROSCOPY Left 03/06/2015   Procedure: ARTHROSCOPY KNEE DEBRIDEMENT  of OCD LESION  AND HARVEST CARTILAGE;  Surgeon: Eugenia Mcalpine, MD;  Location: [facility];  Service: Orthopedics;  Laterality: Left;    Family History  Problem Relation Age of Onset  . Hypertension Mother   . Diabetes Mother   . Hypertension Father   . Diabetes Father     No Known Allergies  Current Outpatient Prescriptions on File Prior to Visit  Medication Sig Dispense Refill  . alum & mag hydroxide-simeth (MAALOX ADVANCED MAX ST) 400-400-40 MG/5ML suspension Take 10 mLs by mouth every 6 (six) hours as needed for indigestion. 355 mL 0  . omeprazole (PRILOSEC) 20 MG capsule Take 1 capsule (20 mg total) by mouth daily. 30 capsule 2  . SYNTHROID 100 MCG tablet TAKE 1 TABLET BY MOUTH  EVERY DAY BEFORE BREAKFAST 30 tablet 2   No current facility-administered medications on file prior to visit.     BP 115/68   Pulse 96   Temp 98.1 F (36.7 C) (Oral)   Resp 16   Ht 6' (1.829 m)   Wt 212 lb (96.2 kg)   SpO2 98%   BMI 28.75 kg/m      Objective:   Physical Exam  General Appearance- Not in acute distress.  HEENT Eyes- Scleraeral/Conjuntiva-bilat- Not Yellow. Mouth & Throat- Normal.  Chest and Lung Exam Auscultation: Breath sounds:-Normal. Adventitious sounds:- No Adventitious sounds.  Cardiovascular Auscultation:Rythm -  Regular. Heart Sounds -Normal heart sounds.  Abdomen Inspection:-Inspection Normal.  Palpation/Perucssion: Palpation and Percussion of the abdomen reveal- mild epigastric and luq Tender, No Rebound tenderness, No rigidity(Guarding) and No Palpable abdominal masses.  Liver:-Normal.  Spleen:- Normal.   Back- no cva tenderness.  Skin- no rash on abdomen.       Assessment & Plan:  For your recent abdomen pain and history of abdomen pain, I want to get x-ray abdomen to see how much stool you have presently.  Also want to get CBC, CMP, amylase and lipase.  In addition might be beneficial to get H. pylori breath test and Ifob test to check for blood.  Currently will advise ranitidine to take daily.  Might advise other medication after labs and x-ray back.  Since you have had symptoms for quite a long time/years, might consider referral to gastroenterologist.  The location of your pain might necessitate further studies such as colonoscopy.  If I refer would defer further workup to gastroenterologist.  Follow-up in 10-14 days or as needed.  Sarah Baez, Ramon DredgeEdward, PA-C

## 2017-06-17 NOTE — Patient Instructions (Addendum)
  For your recent abdomen pain and history of abdomen pain, I want to get x-ray abdomen to see how much stool you have presently.  Also want to get CBC, CMP, amylase and lipase.  In addition might be beneficial to get H. pylori breath test and Ifob test to check for blood.  Currently will advise ranitidine to take daily.  Might advise other medication after labs and x-ray back.  Since you have had symptoms for quite a long time/years, might consider referral to gastroenterologist.  The location of your pain might necessitate further studies such as colonoscopy.  If I refer would defer further workup to gastroenterologist.  Follow-up in 10-14 days or as needed.

## 2017-06-17 NOTE — Addendum Note (Signed)
Addended by: Verdie ShireBAYNES, Bayard More M on: 06/17/2017 04:10 PM   Modules accepted: Orders

## 2017-06-18 LAB — H. PYLORI BREATH TEST: H. pylori Breath Test: NOT DETECTED

## 2017-07-22 ENCOUNTER — Other Ambulatory Visit: Payer: Self-pay | Admitting: Medical

## 2017-10-16 ENCOUNTER — Ambulatory Visit (INDEPENDENT_AMBULATORY_CARE_PROVIDER_SITE_OTHER): Payer: PPO | Admitting: Family Medicine

## 2017-10-16 ENCOUNTER — Ambulatory Visit: Payer: Self-pay | Admitting: *Deleted

## 2017-10-16 ENCOUNTER — Encounter: Payer: Self-pay | Admitting: Family Medicine

## 2017-10-16 VITALS — BP 108/62 | HR 72 | Temp 98.6°F | Ht 71.5 in | Wt 212.1 lb

## 2017-10-16 DIAGNOSIS — K219 Gastro-esophageal reflux disease without esophagitis: Secondary | ICD-10-CM | POA: Diagnosis not present

## 2017-10-16 DIAGNOSIS — R0789 Other chest pain: Secondary | ICD-10-CM

## 2017-10-16 MED ORDER — PANTOPRAZOLE SODIUM 40 MG PO TBEC: 40.0000 mg | DELAYED_RELEASE_TABLET | Freq: Every day | ORAL | 3 refills | Status: DC

## 2017-10-16 MED ORDER — NAPROXEN 500 MG PO TABS: 500.0000 mg | ORAL_TABLET | Freq: Two times a day (BID) | ORAL | 0 refills | Status: DC

## 2017-10-16 NOTE — Telephone Encounter (Signed)
Patient is calling to report that he has been having on/off chest pain. The pain is right and left upper chest. Patient is not having any other symptoms- some back pain at times. He states he has been given zantac in the past- but stopped taking it when the Rx ended- felt it did not help anything. Appointment at office today.  Reason for Disposition . Chest pain lasts > 5 minutes (Exceptions: chest pain occurring > 3 days ago and now asymptomatic; same as previously diagnosed heartburn and has accompanying sour taste in mouth)  Answer Assessment - Initial Assessment Questions 1. LOCATION: "Where does it hurt?"       Toward left today- not as strong this morning 2. RADIATION: "Does the pain go anywhere else?" (e.g., into neck, jaw, arms, back)     Sometimes in the back 3. ONSET: "When did the chest pain begin?" (Minutes, hours or days)      Couple days ago 4. PATTERN "Does the pain come and go, or has it been constant since it started?"  "Does it get worse with exertion?"      Comes and goes 5. DURATION: "How long does it last" (e.g., seconds, minutes, hours)     Depends- few minutes to 30 minutes 6. SEVERITY: "How bad is the pain?"  (e.g., Scale 1-10; mild, moderate, or severe)    - MILD (1-3): doesn't interfere with normal activities     - MODERATE (4-7): interferes with normal activities or awakens from sleep    - SEVERE (8-10): excruciating pain, unable to do any normal activities       Beginning severe- calms 7. CARDIAC RISK FACTORS: "Do you have any history of heart problems or risk factors for heart disease?" (e.g., prior heart attack, angina; high blood pressure, diabetes, being overweight, high cholesterol, smoking, or strong family history of heart disease)     No- hx high cholesterol 8. PULMONARY RISK FACTORS: "Do you have any history of lung disease?"  (e.g., blood clots in lung, asthma, emphysema, birth control pills)     no 9. CAUSE: "What do you think is causing the chest  pain?"     Possible GERD 10. OTHER SYMPTOMS: "Do you have any other symptoms?" (e.g., dizziness, nausea, vomiting, sweating, fever, difficulty breathing, cough)       no 11. PREGNANCY: "Is there any chance you are pregnant?" "When was your last menstrual period?"       n/a  Protocols used: CHEST PAIN-A-AH

## 2017-10-16 NOTE — Telephone Encounter (Signed)
It appears that patient never came in for his recent chest pain.  Would you call him on Monday and see how he is doing.

## 2017-10-16 NOTE — Telephone Encounter (Signed)
FYI

## 2017-10-16 NOTE — Progress Notes (Signed)
Chief Complaint  Patient presents with  . Chest Pain    Timothy Austin is a 32 y.o. male here for evaluation of chest pain.  Duration of issue: 4 days Quality: sharp in beginning Palliation: laying back Provocation: leaning forward, bending to side Severity: 9/10 initially, improving Radiation: across chest Duration of chest pain: 45 minutes Associated symptoms: none Therapies at home:  Cardiac history: none Family heart history: none Smoker? No   +eructation and fullness. Has been using Zantac w/o relief. Has also used PPI in past. No burning or pain. No weight loss, N/V.   ROS:  Cardiac: No current chest pain Lungs: No SOB  Past Medical History:  Diagnosis Date  . Dizziness   . Hypercholesteremia   . Osteochondritis dissecans of left knee   . Thyroid disease   . Wears glasses    Family History  Problem Relation Age of Onset  . Hypertension Mother   . Diabetes Mother   . Hypertension Father   . Diabetes Father    Social History   Socioeconomic History  . Marital status: Married  Tobacco Use  . Smoking status: Never Smoker  . Smokeless tobacco: Never Used  Substance and Sexual Activity  . Alcohol use: No  . Drug use: No   Allergies as of 10/16/2017   No Known Allergies     Medication List        Accurate as of 10/16/17  4:34 PM. Always use your most recent med list.          naproxen 500 MG tablet Commonly known as:  NAPROSYN Take 1 tablet (500 mg total) by mouth 2 (two) times daily with a meal.   pantoprazole 40 MG tablet Commonly known as:  PROTONIX Take 1 tablet (40 mg total) by mouth daily.   SYNTHROID 100 MCG tablet Generic drug:  levothyroxine TAKE 1 TABLET BY MOUTH EVERY DAY BEFORE BREAKFAST       BP 108/62 (BP Location: Left Arm, Patient Position: Sitting, Cuff Size: Normal)   Pulse 72   Temp 98.6 F (37 C) (Oral)   Ht 5' 11.5" (1.816 m)   Wt 212 lb 2 oz (96.2 kg)   SpO2 97%   BMI 29.17 kg/m  Gen: awake, alert, appears  stated age HEENT: PERRLA, MMM Neck: No masses or asymmetry  Heart: RRR, no bruits, no LE edema Lungs: CTAB, no accessory muscle use Abd: Soft, NT, ND, no masses or organomegaly MSK: chest pain is reproducible to palpation across anterior chest Psych: Age appropriate judgment and insight, nml mood and affect  Other chest pain - Plan: EKG 12-Lead, naproxen (NAPROSYN) 500 MG tablet  Gastroesophageal reflux disease, esophagitis presence not specified - Plan: pantoprazole (PROTONIX) 40 MG tablet  Orders as above.  EKG is unremarkable.  No risk factors.  Not exertional.  Very low likelihood to be cardiac or pulmonary in origin. Try PPI.  Eructation diet given. F/u in 6 weeks with regular PCP to discuss eructation/reflux. The patient voiced understanding and agreement to the plan.  Jilda Rocheicholas [facility], DO 10/16/17 4:34 PM

## 2017-10-16 NOTE — Progress Notes (Signed)
Pre visit review using our clinic review tool, if applicable. No additional management support is needed unless otherwise documented below in the visit note. 

## 2017-10-16 NOTE — Patient Instructions (Addendum)
Ice/cold pack over area for 10-15 min twice daily.  Heat (pad or rice pillow in microwave) over affected area, 10-15 minutes twice daily.   OK to take Tylenol 1000 mg (2 extra strength tabs) or 975 mg (3 regular strength tabs) every 6 hours as needed.  Stretch the area.   95% of chest pain that is reproducible to palpation is not related to the heart or lungs.  Stop chewing gum, drinking carbonated beverages, gulping liquids, and drinking alcohol to help with belching.  These foods may cause you to belch more: Wheat, barley, rye, onion, leek, white part of spring onion, garlic, shallots, artichokes, beetroot, fennel, peas, chicory, pistachio, cashews, legumes, lentils, and chickpeas; Milk, custard, ice cream, and yogurt; Apples, pears, mangoes, cherries, watermelon, asparagus, sugar snap peas, honey, high-fructose corn syrup; Apricots, nectarines, peaches, plums, mushrooms, cauliflower, artificially sweetened chewing gum and confectionery   Let us know if you need anything.

## 2017-10-18 ENCOUNTER — Other Ambulatory Visit: Payer: Self-pay | Admitting: Medical

## 2017-10-19 NOTE — Telephone Encounter (Signed)
Left pt a message to call back. 

## 2017-10-20 ENCOUNTER — Telehealth: Payer: Self-pay | Admitting: Medical

## 2017-10-20 NOTE — Telephone Encounter (Signed)
Called pt back and left message for him to set up a transfer of care appt with Dr. Carmelia RollerWendling at his early convenience.

## 2017-10-20 NOTE — Telephone Encounter (Signed)
OK w me.  

## 2017-10-20 NOTE — Telephone Encounter (Signed)
Ok with me 

## 2017-10-20 NOTE — Telephone Encounter (Signed)
[facility] Please advise.

## 2017-10-21 NOTE — Telephone Encounter (Signed)
[facility]

## 2017-10-23 ENCOUNTER — Telehealth: Payer: Self-pay | Admitting: Family Medicine

## 2017-10-23 MED ORDER — SYNTHROID 100 MCG PO TABS: ORAL_TABLET | ORAL | 0 refills | Status: DC

## 2017-10-23 NOTE — Telephone Encounter (Signed)
Copied from CRM [id number]. Topic: Quick Communication - Rx Refill/Question >> Oct 23, 2017  1:19 PM Landry MellowFoltz, Melissa J wrote: Medication: SYNTHROID 100 MCG tablet   Has the patient contacted their pharmacy? Yes.   Pharm told him denied    (Agent: If no, request that the patient contact the pharmacy for the refill.)   Preferred Pharmacy (with phone number or street name): [facility] [address]  Pt is asking for enough qty to get to his 11/18/17 appt  He has 2 tabs left     Agent: Please be advised that RX refills may take up to 3 business days. We ask that you follow-up with your pharmacy.

## 2017-11-18 ENCOUNTER — Encounter: Payer: Self-pay | Admitting: Family Medicine

## 2017-11-18 ENCOUNTER — Ambulatory Visit (INDEPENDENT_AMBULATORY_CARE_PROVIDER_SITE_OTHER): Payer: PPO | Admitting: Family Medicine

## 2017-11-18 VITALS — BP 110/64 | HR 68 | Temp 98.4°F | Ht 72.0 in | Wt 214.0 lb

## 2017-11-18 DIAGNOSIS — E039 Hypothyroidism, unspecified: Secondary | ICD-10-CM | POA: Diagnosis not present

## 2017-11-18 DIAGNOSIS — Z0000 Encounter for general adult medical examination without abnormal findings: Secondary | ICD-10-CM

## 2017-11-18 DIAGNOSIS — Z114 Encounter for screening for human immunodeficiency virus [HIV]: Secondary | ICD-10-CM

## 2017-11-18 DIAGNOSIS — R1012 Left upper quadrant pain: Secondary | ICD-10-CM

## 2017-11-18 LAB — COMPREHENSIVE METABOLIC PANEL
ALK PHOS: 58 U/L (ref 39–117)
ALT: 31 U/L (ref 0–53)
AST: 19 U/L (ref 0–37)
Albumin: 4.1 g/dL (ref 3.5–5.2)
BUN: 20 mg/dL (ref 6–23)
CHLORIDE: 104 meq/L (ref 96–112)
CO2: 30 meq/L (ref 19–32)
Calcium: 9.6 mg/dL (ref 8.4–10.5)
Creatinine, Ser: 0.85 mg/dL (ref 0.40–1.50)
GFR: 110.78 mL/min (ref >60.00)
Glucose, Bld: 90 mg/dL (ref 70–99)
POTASSIUM: 4.4 meq/L (ref 3.5–5.1)
Sodium: 140 mEq/L (ref 135–145)
TOTAL PROTEIN: 7.6 g/dL (ref 6.0–8.3)
Total Bilirubin: 0.5 mg/dL (ref 0.2–1.2)

## 2017-11-18 LAB — LIPID PANEL
CHOL/HDL RATIO: 7
Cholesterol: 185 mg/dL (ref 0–200)
HDL: 26.4 mg/dL — AB (ref >39.00)
LDL CALC: 133 mg/dL — AB (ref 0–99)
NONHDL: 158.33
Triglycerides: 127 mg/dL (ref 0.0–149.0)
VLDL: 25.4 mg/dL (ref 0.0–40.0)

## 2017-11-18 LAB — TSH: TSH: 5.88 u[IU]/mL — ABNORMAL HIGH (ref 0.35–4.50)

## 2017-11-18 NOTE — Progress Notes (Signed)
Chief Complaint  Patient presents with  . Annual Exam    Well Male Timothy Austin is here for a complete physical.   His last physical was >1 year ago.  Current diet: in general, a "healthy" diet   Current exercise: Some walking Weight trend: stable Does pt snore? No. Daytime fatigue? No. Seat belt? Yes.    Health maintenance Tetanus- Yes- 03/18/2014 HIV- No  Past Medical History:  Diagnosis Date  . Dizziness   . Hypercholesteremia   . Osteochondritis dissecans of left knee   . Thyroid disease   . Wears glasses     Past Surgical History:  Procedure Laterality Date  . APPENDECTOMY  age 20  . CHONDROPLASTY Left 03/06/2015   Procedure: CHONDROPLASTY;  Surgeon: Eugenia Mcalpine, MD;  Location: [facility];  Service: Orthopedics;  Laterality: Left;  . KNEE ARTHROSCOPY Left 03/06/2015   Procedure: ARTHROSCOPY KNEE DEBRIDEMENT  of OCD LESION  AND HARVEST CARTILAGE;  Surgeon: Eugenia Mcalpine, MD;  Location: [facility];  Service: Orthopedics;  Laterality: Left;   Medications  Current Outpatient Medications on File Prior to Visit  Medication Sig Dispense Refill  . naproxen (NAPROSYN) 500 MG tablet Take 1 tablet (500 mg total) by mouth 2 (two) times daily with a meal. 30 tablet 0  . pantoprazole (PROTONIX) 40 MG tablet Take 1 tablet (40 mg total) by mouth daily. 30 tablet 3  . SYNTHROID 100 MCG tablet TAKE 1 TABLET BY MOUTH EVERY DAY BEFORE BREAKFAST 30 tablet 0   Allergies No Known Allergies   Family History Family History  Problem Relation Age of Onset  . Hypertension Mother   . Diabetes Mother   . Hypertension Father   . Diabetes Father     Review of Systems: Constitutional: no fevers or chills Eye:  no recent significant change in vision, +light sensitivity Ear/Nose/Mouth/Throat:  Ears:  no tinnitus or hearing loss Nose/Mouth/Throat:  no complaints of nasal congestion or bleeding, no sore throat Cardiovascular:  no chest pain, no  palpitations Respiratory:  no cough and no shortness of breath Gastrointestinal:  +abdominal pain, no change in bowel habits, no nausea, vomiting, diarrhea, or constipation and no black or bloody stool GU:  Male: negative for dysuria, frequency, and incontinence and negative for prostate symptoms Musculoskeletal/Extremities:  no pain, redness, or swelling of the joints Integumentary (Skin/Breast):  no abnormal skin lesions reported Neurologic:  no headaches, no numbness, tingling Endocrine: No unexpected weight changes Hematologic/Lymphatic:  no night sweats  Exam BP 110/64 (BP Location: Left Arm, Patient Position: Sitting, Cuff Size: Normal)   Pulse 68   Temp 98.4 F (36.9 C) (Oral)   Ht 6' (1.829 m)   Wt 214 lb (97.1 kg)   SpO2 97%   BMI 29.02 kg/m  General:  well developed, well nourished, in no apparent distress Skin:  no significant moles, warts, or growths Head:  no masses, lesions, or tenderness Eyes:  pupils equal and round, sclera anicteric without injection Ears:  canals without lesions, TMs shiny without retraction, no obvious effusion, no erythema Nose:  nares patent, septum midline, mucosa normal Throat/Pharynx:  lips and gingiva without lesion; tongue and uvula midline; non-inflamed pharynx; no exudates or postnasal drainage Neck: neck supple without adenopathy, thyromegaly, or masses Lungs:  clear to auscultation, breath sounds equal bilaterally, no respiratory distress Cardio:  regular rate and rhythm without murmurs, heart sounds without clicks or rubs Abdomen:  abdomen soft, nontender; bowel sounds normal; no masses or organomegaly Genital (male): Pt  declined Musculoskeletal:  symmetrical muscle groups noted without atrophy or deformity Extremities:  no clubbing, cyanosis, or edema, no deformities, no skin discoloration Neuro:  gait normal; deep tendon reflexes normal and symmetric Psych: well oriented with normal range of affect and appropriate  judgment/insight  Assessment and Plan  Well adult exam - Plan: Lipid panel, Comprehensive metabolic panel  Hypothyroidism, unspecified type - Plan: TSH  Left upper quadrant pain - Plan: Ambulatory referral to Gastroenterology   Well 32 y.o. male. Counseled on diet and exercise. Pt having cont'd abd pain (none today on exam), initially got better on PPI, but started coming back. He is requesting referral to GI, will refer. Other orders as above. Pt requesting labs to ck Vit in absence of dx of def or other s/s's. He will check w insurance and let us know, I will place order if they are covered.  Follow up in 1 year pending the above workup. The patient voiced understanding and agreement to the plan.  Jilda Rocheicholas Paul LowellWendling, DO 11/18/17 9:23 AM

## 2017-11-18 NOTE — Progress Notes (Signed)
Pre visit review using our clinic review tool, if applicable. No additional management support is needed unless otherwise documented below in the visit note. 

## 2017-11-18 NOTE — Patient Instructions (Addendum)
Stay active.  Keep the diet clean.  Let us know if you need anything.  

## 2017-11-19 ENCOUNTER — Other Ambulatory Visit (INDEPENDENT_AMBULATORY_CARE_PROVIDER_SITE_OTHER): Payer: PPO

## 2017-11-19 ENCOUNTER — Other Ambulatory Visit: Payer: Self-pay | Admitting: Family Medicine

## 2017-11-19 DIAGNOSIS — E039 Hypothyroidism, unspecified: Secondary | ICD-10-CM

## 2017-11-19 LAB — HIV ANTIBODY (ROUTINE TESTING W REFLEX): HIV: NONREACTIVE

## 2017-11-19 LAB — T4, FREE: Free T4: 0.8 ng/dL (ref 0.60–1.60)

## 2017-11-27 ENCOUNTER — Ambulatory Visit: Payer: PPO | Admitting: Medical

## 2017-12-30 ENCOUNTER — Other Ambulatory Visit: Payer: Self-pay | Admitting: Family Medicine

## 2018-01-18 ENCOUNTER — Encounter: Payer: Self-pay | Admitting: Family Medicine

## 2018-01-18 ENCOUNTER — Encounter: Payer: Self-pay | Admitting: Gastroenterology

## 2018-01-18 ENCOUNTER — Telehealth: Payer: Self-pay | Admitting: Family Medicine

## 2018-01-18 NOTE — Telephone Encounter (Signed)
Copied from CRM [phone]. Topic: General - Other >> Jan 18, 2018  5:41 PM Mcneil, Ja-Kwan wrote: Reason for CRM: Pt states he received a voicemail from Dr AMR CorporationWendling's office asking him to return the call. Pt requests a call back. Cb# [phone]  Called left message (detailed) to call back.

## 2018-01-21 ENCOUNTER — Ambulatory Visit (INDEPENDENT_AMBULATORY_CARE_PROVIDER_SITE_OTHER): Payer: PPO | Admitting: Family Medicine

## 2018-01-21 ENCOUNTER — Encounter: Payer: Self-pay | Admitting: Family Medicine

## 2018-01-21 VITALS — BP 98/62 | HR 80 | Temp 98.7°F | Ht 69.5 in | Wt 215.2 lb

## 2018-01-21 DIAGNOSIS — R1084 Generalized abdominal pain: Secondary | ICD-10-CM

## 2018-01-21 MED ORDER — DICYCLOMINE HCL 10 MG PO CAPS: 10.0000 mg | ORAL_CAPSULE | Freq: Three times a day (TID) | ORAL | 1 refills | Status: DC

## 2018-01-21 NOTE — Progress Notes (Signed)
Chief Complaint  Patient presents with  . Abdominal Pain    GERD Timothy Austin is a 32 y.o. male who presents for evaluation of abdominal bloating. Aggravating factors and specific triggers include Eating, no particular foods have been identified. Alleviating factors include none; he was prescribed pantoprazole at the last visit that was somewhat helpful at first He has an appointment scheduled with gastroenterology but does not want to wait that long.  Past Medical History:  Diagnosis Date  . Dizziness   . Hypercholesteremia   . Osteochondritis dissecans of left knee   . Thyroid disease   . Wears glasses    Review of Systems Constitutional:  no unexpected change in weight Gastrointestinal: as noted in HPI  Exam BP 98/62 (BP Location: Left Arm, Patient Position: Sitting, Cuff Size: Large)   Pulse 80   Temp 98.7 F (37.1 C) (Oral)   Ht 5' 9.5" (1.765 m)   Wt 215 lb 4 oz (97.6 kg)   SpO2 97%   BMI 31.33 kg/m  General:  well developed, well nourished, in no apparent distress Skin:  warm, no pallor or diaphoresis Thorax:  nontender Lungs:  clear to auscultation, breath sounds equal bilaterally, no respiratory distress, no wheezes Cardio:  regular rate and rhythm without murmurs, heart sounds without clicks or rubs Abdomen:  abdomen soft, + epigastric tenderness to palpation, positive Murphy sign; bowel sounds normal; no masses or organomegaly Psych: Well oriented with normal range of affect appropriate judgment/insight  Assessment and Plan  Generalized abdominal pain - Plan: US Abdomen Limited RUQ, dicyclomine (BENTYL) 10 MG capsule  Ck US. Trial Bentyl. Cont to mind PO intake. F/u prn. Pt voiced understanding and agreement to plan.  Jilda Rocheicholas Paul WatersmeetWendling, DO 01/21/18  4:32 PM

## 2018-01-21 NOTE — Progress Notes (Signed)
Pre visit review using our clinic review tool, if applicable. No additional management support is needed unless otherwise documented below in the visit note. 

## 2018-01-21 NOTE — Patient Instructions (Addendum)
If you don't hear anything about your ultrasound in the next few days, call us or send a message to ask for an update. We will be in touch regarding the results.   Stay hydrated.   Let us know if you need anything.

## 2018-01-23 ENCOUNTER — Encounter: Payer: Self-pay | Admitting: Family Medicine

## 2018-01-24 ENCOUNTER — Ambulatory Visit (HOSPITAL_BASED_OUTPATIENT_CLINIC_OR_DEPARTMENT_OTHER)
Admission: RE | Admit: 2018-01-24 | Discharge: 2018-01-24 | Disposition: A | Discharge status: Home / Self Care | Payer: PPO | Source: Ambulatory Visit | Attending: Family Medicine | Admitting: Family Medicine

## 2018-01-24 DIAGNOSIS — R1084 Generalized abdominal pain: Secondary | ICD-10-CM | POA: Insufficient documentation

## 2018-01-26 ENCOUNTER — Other Ambulatory Visit: Payer: Self-pay | Admitting: Family Medicine

## 2018-01-26 ENCOUNTER — Encounter: Payer: Self-pay | Admitting: Family Medicine

## 2018-01-27 NOTE — Telephone Encounter (Signed)
MyChart message routed to Dr. Carmelia RollerWendling for review re: restarting pantoprozole.

## 2018-02-02 ENCOUNTER — Telehealth: Payer: Self-pay | Admitting: Family Medicine

## 2018-02-02 DIAGNOSIS — R1084 Generalized abdominal pain: Secondary | ICD-10-CM
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
## 2018-02-03 MED ORDER — DICYCLOMINE HCL 10 MG PO CAPS: 10.0000 mg | ORAL_CAPSULE | Freq: Three times a day (TID) | ORAL | 1 refills | Status: DC

## 2018-02-03 NOTE — Telephone Encounter (Signed)
Refill done.  
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
## 2018-02-19 ENCOUNTER — Encounter (HOSPITAL_BASED_OUTPATIENT_CLINIC_OR_DEPARTMENT_OTHER): Payer: Self-pay | Admitting: *Deleted

## 2018-02-19 ENCOUNTER — Emergency Department (HOSPITAL_BASED_OUTPATIENT_CLINIC_OR_DEPARTMENT_OTHER)
Admission: EM | Admit: 2018-02-19 | Discharge: 2018-02-19 | Disposition: A | Discharge status: Home / Self Care | Payer: PPO | Attending: Emergency Medicine | Admitting: Emergency Medicine

## 2018-02-19 ENCOUNTER — Ambulatory Visit: Payer: Self-pay | Admitting: *Deleted

## 2018-02-19 ENCOUNTER — Emergency Department (HOSPITAL_BASED_OUTPATIENT_CLINIC_OR_DEPARTMENT_OTHER): Payer: PPO

## 2018-02-19 ENCOUNTER — Other Ambulatory Visit: Payer: Self-pay

## 2018-02-19 DIAGNOSIS — E7800 Pure hypercholesterolemia, unspecified: Secondary | ICD-10-CM | POA: Insufficient documentation

## 2018-02-19 DIAGNOSIS — R072 Precordial pain: Secondary | ICD-10-CM | POA: Insufficient documentation

## 2018-02-19 DIAGNOSIS — R0789 Other chest pain: Secondary | ICD-10-CM

## 2018-02-19 DIAGNOSIS — Z79899 Other long term (current) drug therapy: Secondary | ICD-10-CM | POA: Diagnosis not present

## 2018-02-19 DIAGNOSIS — R079 Chest pain, unspecified: Secondary | ICD-10-CM | POA: Diagnosis present

## 2018-02-19 LAB — BASIC METABOLIC PANEL
ANION GAP: 6 (ref 5–15)
BUN: 16 mg/dL (ref 6–20)
CO2: 28 mmol/L (ref 22–32)
Calcium: 9 mg/dL (ref 8.9–10.3)
Chloride: 103 mmol/L (ref 98–111)
Creatinine, Ser: 1.04 mg/dL (ref 0.61–1.24)
GFR calc Af Amer: >60 mL/min (ref >60)
GFR calc non Af Amer: >60 mL/min (ref >60)
GLUCOSE: 151 mg/dL — AB (ref 70–99)
Potassium: 3.4 mmol/L — ABNORMAL LOW (ref 3.5–5.1)
Sodium: 137 mmol/L (ref 135–145)

## 2018-02-19 LAB — CBC
HCT: 40.6 % (ref 39.0–52.0)
HEMOGLOBIN: 14.1 g/dL (ref 13.0–17.0)
MCH: 30.3 pg (ref 26.0–34.0)
MCHC: 34.7 g/dL (ref 30.0–36.0)
MCV: 87.3 fL (ref 78.0–100.0)
Platelets: 185 10*3/uL (ref 150–400)
RBC: 4.65 MIL/uL (ref 4.22–5.81)
RDW: 12.4 % (ref 11.5–15.5)
WBC: 4.8 10*3/uL (ref 4.0–10.5)

## 2018-02-19 LAB — TROPONIN I: Troponin I: <0.03 ng/mL (ref <0.03)

## 2018-02-19 MED ORDER — NAPROXEN 500 MG PO TABS: 500.0000 mg | ORAL_TABLET | Freq: Two times a day (BID) | ORAL | 0 refills | Status: DC

## 2018-02-19 MED ORDER — PANTOPRAZOLE SODIUM 20 MG PO TBEC: 20.0000 mg | DELAYED_RELEASE_TABLET | Freq: Every day | ORAL | 0 refills | Status: DC

## 2018-02-19 NOTE — ED Notes (Signed)
ED Provider at bedside. 

## 2018-02-19 NOTE — ED Provider Notes (Signed)
[facility] EMERGENCY DEPARTMENT Provider Note   CSN: [id number] Arrival date & time: 02/19/18  1436     History   Chief Complaint Chief Complaint  Patient presents with  . Chest Pain    HPI Timothy Austin is a 32 y.o. male.  HPI Patient reports of chest pain for 2 days.  Left-sided aching and sharp quality.  Patient perceives pain from his left anterior mid to lower chest around to his shoulder blade.  Reports is worse when he wakes up in the morning and has been lying on the left side.  It can be made worse by certain reaching and twisting motions.  No recent cough, shortness of breath or fever.  No abdominal pain or vomiting.  No lower extremity swelling or calf pain.  Patient reports he has had some chest pain in the past and usually it has been diagnosed as a muscle strain.  No known cardiac or pulmonary disease. Past Medical History:  Diagnosis Date  . Dizziness   . Hypercholesteremia   . Osteochondritis dissecans of left knee   . Thyroid disease   . Wears glasses     Patient Active Problem List   Diagnosis Date Noted  . Hypothyroidism 11/18/2017  . Wellness examination 09/21/2015  . S/P left knee arthroscopy 03/06/2015    Past Surgical History:  Procedure Laterality Date  . APPENDECTOMY  age 49  . CHONDROPLASTY Left 03/06/2015   Procedure: CHONDROPLASTY;  Surgeon: Eugenia Mcalpine, MD;  Location: [facility];  Service: Orthopedics;  Laterality: Left;  . KNEE ARTHROSCOPY Left 03/06/2015   Procedure: ARTHROSCOPY KNEE DEBRIDEMENT  of OCD LESION  AND HARVEST CARTILAGE;  Surgeon: Eugenia Mcalpine, MD;  Location[facility];  Service: Orthopedics;  Laterality: Left;        Home Medications    Prior to Admission medications   Medication Sig Start Date End Date Taking? Authorizing Provider  pantoprazole (PROTONIX) 40 MG tablet Take 1 tablet (40 mg total) by mouth daily. 10/16/17  Yes Sharlene Dory, DO  SYNTHROID 100 MCG  tablet TAKE 1 TABLET BY MOUTH EVERY DAY BEFORE BREAKFAST 01/26/18  Yes Wendling, Jilda Roche, DO  dicyclomine (BENTYL) 10 MG capsule Take 1 capsule (10 mg total) by mouth 4 (four) times daily -  before meals and at bedtime. 02/03/18   Sharlene Dory, DO  naproxen (NAPROSYN) 500 MG tablet Take 1 tablet (500 mg total) by mouth 2 (two) times daily with a meal. 10/16/17   Wendling, Jilda Roche, DO  naproxen (NAPROSYN) 500 MG tablet Take 1 tablet (500 mg total) by mouth 2 (two) times daily. 02/19/18   Arby Barrette, MD  pantoprazole (PROTONIX) 20 MG tablet Take 1 tablet (20 mg total) by mouth daily. 02/19/18   Arby Barrette, MD    Family History Family History  Problem Relation Age of Onset  . Hypertension Mother   . Diabetes Mother   . Hypertension Father   . Diabetes Father     Social History Social History   Tobacco Use  . Smoking status: Never Smoker  . Smokeless tobacco: Never Used  Substance Use Topics  . Alcohol use: No  . Drug use: No     Allergies   Patient has no known allergies.   Review of Systems Review of Systems 10 Systems reviewed and are negative for acute change except as noted in the HPI.    Physical Exam Updated Vital Signs BP 123/76   Pulse 74  Temp 98.9 F (37.2 C) (Oral)   Resp 18   Ht 5' 7.72" (1.72 m)   Wt 97.5 kg (215 lb)   SpO2 98%   BMI 32.96 kg/m   Physical Exam  Constitutional: He is oriented to person, place, and time. He appears well-developed and well-nourished.  HENT:  Head: Normocephalic and atraumatic.  Eyes: Pupils are equal, round, and reactive to light. EOM are normal.  Neck: Neck supple.  Cardiovascular: Normal rate, regular rhythm, normal heart sounds and intact distal pulses.  Pulmonary/Chest: Effort normal and breath sounds normal.  Moderately reproducible chest wall pain in the left anterior chest at the sternocostal margins lower mid chest.  No rash or soft tissue abnormalities.  Abdominal: Soft. Bowel  sounds are normal. He exhibits no distension. There is no tenderness.  Musculoskeletal: Normal range of motion. He exhibits no edema or tenderness.  Calf soft and nontender.  Neurological: He is alert and oriented to person, place, and time. He has normal strength. Coordination normal. GCS eye subscore is 4. GCS verbal subscore is 5. GCS motor subscore is 6.  Skin: Skin is warm, dry and intact.  Psychiatric: He has a normal mood and affect.     ED Treatments / Results  Labs (all labs ordered are listed, but only abnormal results are displayed) Labs Reviewed  BASIC METABOLIC PANEL - Abnormal; Notable for the following components:      Result Value   Potassium 3.4 (*)    Glucose, Bld 151 (*)    All other components within normal limits  CBC  TROPONIN I    EKG EKG Interpretation  Date/Time:  Friday February 19 2018 14:41:56 EDT Ventricular Rate:  74 PR Interval:    QRS Duration: 86 QT Interval:  358 QTC Calculation: 398 R Axis:   21 Text Interpretation:  Sinus rhythm No significant change since last tracing Confirmed by Gwyneth Sproutlunkett, Whitney ([id number]) on 02/19/2018 2:48:49 PM   Radiology Dg Chest 2 View  Result Date: 02/19/2018 CLINICAL DATA:  Chest pain EXAM: CHEST - 2 VIEW COMPARISON:  March 04, 2017 FINDINGS: No edema or consolidation. The heart size and pulmonary vascularity are normal. No adenopathy. No pneumothorax. No bone lesions. IMPRESSION: No edema or consolidation. Electronically Signed   By: Bretta BangWilliam  Woodruff III M.D.   On: 02/19/2018 15:30    Procedures Procedures (including critical care time)  Medications Ordered in ED Medications - No data to display   Initial Impression / Assessment and Plan / ED Course  I have reviewed the triage vital signs and the nursing notes.  Pertinent labs & imaging results that were available during my care of the patient were reviewed by me and considered in my medical decision making (see chart for details).     Final Clinical  Impressions(s) / ED Diagnoses   Final diagnoses:  Chest wall pain  Precordial chest pain   Chest pain has been present for 2 days.  It has a strongly positional component.  EKG unchanged.  Troponin negative.  Vital signs normal without tachycardia or hypoxia.  No personal history or known family history of DVT or PE.  Patient does have incidentally mild elevated blood glucose.  He reports parents are diabetic.  He is in basic good physical condition.  He is counseled on necessary follow-up with his PCP and given information on prediabetes.  Patient was discharged in good condition.  Plan will be to treat with naproxen for suspected chest wall pain.  Patient had previously been on  Protonix.  Is counseled to restart Protonix for the next 2 weeks and then discuss any continuation with his PCP. ED Discharge Orders        Ordered    naproxen (NAPROSYN) 500 MG tablet  2 times daily     02/19/18 1554    pantoprazole (PROTONIX) 20 MG tablet  Daily     02/19/18 1554       Arby Barrette, MD 02/19/18 1608

## 2018-02-19 NOTE — Telephone Encounter (Signed)
Patient is calling to report that he is having pain in his chest for 2 days- he is concerned that the pain is different than what he has had before. Patient states he had pain in his shoulder and back- he does not report any activity that he feels he could have strained it. Patient advised to go to ED pre protocol. Reason for Disposition . Pain also present in shoulder(s) or arm(s) or jaw  (Exception: pain is clearly made worse by movement)  Answer Assessment - Initial Assessment Questions 1. LOCATION: "Where does it hurt?"       Upper left chest pain 2. RADIATION: "Does the pain go anywhere else?" (e.g., into neck, jaw, arms, back)     Shoulder left, back on left 3. ONSET: "When did the chest pain begin?" (Minutes, hours or days)      2 days 4. PATTERN "Does the pain come and go, or has it been constant since it started?"  "Does it get worse with exertion?"      Comes and goes, pain gets worse when patient raises his hand 5. DURATION: "How long does it last" (e.g., seconds, minutes, hours)     Lasts different periods of time- 1 hour to a few minutes 6. SEVERITY: "How bad is the pain?"  (e.g., Scale 1-10; mild, moderate, or severe)    - MILD (1-3): doesn't interfere with normal activities     - MODERATE (4-7): interferes with normal activities or awakens from sleep    - SEVERE (8-10): excruciating pain, unable to do any normal activities       moderate 7. CARDIAC RISK FACTORS: "Do you have any history of heart problems or risk factors for heart disease?" (e.g., prior heart attack, angina; high blood pressure, diabetes, being overweight, high cholesterol, smoking, or strong family history of heart disease)     no 8. PULMONARY RISK FACTORS: "Do you have any history of lung disease?"  (e.g., blood clots in lung, asthma, emphysema, birth control pills)     no 9. CAUSE: "What do you think is causing the chest pain?"     Cold-maybe heart 10. OTHER SYMPTOMS: "Do you have any other symptoms?"  (e.g., dizziness, nausea, vomiting, sweating, fever, difficulty breathing, cough)       no 11. PREGNANCY: "Is there any chance you are pregnant?" "When was your last menstrual period?"       n/a  Protocols used: CHEST PAIN-A-AH

## 2018-02-19 NOTE — Discharge Instructions (Addendum)
Take naproxen with food twice a day for the next 5-7 days. If you have stopped taking Protonix, restart as prescribed. Make an appointment to see your doctor in 3-5 days. Return to the emergency department if your symptoms are worsening. Your blood sugar was mildly elevated at 151 mg/dl. Your doctor should recheck your blood sugar after fasting to check for pre-diabetes.

## 2018-02-19 NOTE — ED Triage Notes (Signed)
Pt has CP on the left side radiating to back. X 2 days.

## 2018-02-19 NOTE — Telephone Encounter (Signed)
[facility]asa Parkinauthor for reported chest pain. Routed to Dr. Carmelia RollerWendling for notification.

## 2018-02-23 ENCOUNTER — Encounter: Payer: Self-pay | Admitting: Gastroenterology

## 2018-02-23 ENCOUNTER — Ambulatory Visit (INDEPENDENT_AMBULATORY_CARE_PROVIDER_SITE_OTHER): Payer: PPO | Admitting: Gastroenterology

## 2018-02-23 VITALS — BP 116/68 | HR 74 | Ht 67.72 in | Wt 214.2 lb

## 2018-02-23 DIAGNOSIS — R1032 Left lower quadrant pain: Secondary | ICD-10-CM | POA: Diagnosis not present

## 2018-02-23 DIAGNOSIS — R1013 Epigastric pain: Secondary | ICD-10-CM | POA: Diagnosis not present

## 2018-02-23 NOTE — Patient Instructions (Addendum)
You will be set up for a CT scan of abdomen and pelvis with IV and oral contrast for epigastric, LLQ, left flank pains. Please start taking citrucel (orange flavored) powder fiber supplement.  This may cause some bloating at first but that usually goes away. Begin with a small spoonful and work your way up to a large, heaping spoonful daily over a week. You should change the way you are taking your antiacid medicine (pantoprazole 33m pills) so that you are taking it 20-30 minutes prior to a decent meal as that is the way the pill is designed to work most effectively. You may need further testing pending the CT result above (EGD).  You have been scheduled for [place] CT [facility] the [place] and [place] [id number]this is in the same building as [facility].   You are scheduled on 03/01/18 at 1130. You should arrive 15 minutes prior to your appointment time for registration. Please follow the written instructions below on the day of your exam:  WARNING: IF YOU ARE ALLERGIC TO IODINE/X-RAY DYE, PLEASE NOTIFY RADIOLOGY IMMEDIATELY AT [phone] YOU WILL BE GIVEN A 13 HOUR PREMEDICATION PREP.  1) Do not eat or drink anything after 730am (4 hours prior to your test) 2) You have been given 2 bottles of oral contrast to drink. The solution may taste better if refrigerated, but do NOT add ice or any other liquid to this solution. Shake well before drinking.    Drink 1 bottle of contrast @ 930 (2 hours prior to your exam)  Drink 1 bottle of contrast @ 1030 (1 hour prior to your exam)  You may take any medications as prescribed with a small amount of water except for the following: Metformin, Glucophage, Glucovance, Avandamet, Riomet, Fortamet, Actoplus Met, Janumet, Glumetza or Metaglip. The above medications must be held the day of the exam AND 48 hours after the exam.  The purpose of you drinking the oral contrast is to aid in the visualization of your intestinal  tract. The contrast solution may cause some diarrhea. Before your exam is started, you will be given a small amount of fluid to drink. Depending on your individual set of symptoms, you may also receive an intravenous injection of x-ray contrast/dye. [facility] depending on the type of exam you are having performed.  This test typically takes 30-45 minutes to complete.  If you have any questions regarding your exam or if you need to reschedule, you may call the CT department at [phone] the hours of 8:00 am and 5:00 pm, Monday-Friday.  ________________________________________________________________________

## 2018-02-23 NOTE — Progress Notes (Signed)
HPI: This is a very pleasant 32 year old man who was referred to me by Sharlene DoryWendling, Nicholas Paul*  to evaluate epigastric pain, alternating bowel habits, left lower quadrant pain.    Chief complaint is epigastric pain, left lower quadrant pain, alternating bowel habits.  Epigastric pains, tingling in his chest, also LLQ pain wrapping aroiund to his left back. Cannot sleep on his left side due to pain in left side.  These have been going on for months.  He was in the emergency room recently, see ultrasound and blood test below.  He he sometimes has pyrosis.  He started taking pantoprazole 20 mg just in the past few days.  He has not noticed much improvement since then.  Stable weight.   He feels that he has a lump in his left upper quadrant.  He thinks it is asymmetric.   Pretty rare NSAID  Alternates with loose stools with constipation.  Drinks coffee daily two large.  Old Data Reviewed: Blood tests in the last several months showed normal CBC, slightly low potassium but otherwise normal complete metabolic profile.  H. pylori breath test October 2018 was negative a year prior it was checked and it was also negative.  Pancreatic enzymes were normal. Abdominal ultrasound June 2019 was "within normal limits".     Review of systems: Pertinent positive and negative review of systems were noted in the above HPI section. All other review negative.   Past Medical History:  Diagnosis Date  . Dizziness   . Hypercholesteremia   . Osteochondritis dissecans of left knee   . Thyroid disease   . Wears glasses     Past Surgical History:  Procedure Laterality Date  . APPENDECTOMY  age 32  . CHONDROPLASTY Left 03/06/2015   Procedure: CHONDROPLASTY;  Surgeon: Eugenia Mcalpineobert Collins, MD;  [facility] [place];  Service: Orthopedics;  Laterality: Left;  . KNEE ARTHROSCOPY Left 03/06/2015   Procedure: ARTHROSCOPY KNEE DEBRIDEMENT  of OCD LESION  AND HARVEST CARTILAGE;  Surgeon: Eugenia Mcalpineobert  Collins, MD;  [facility] [place];  Service: Orthopedics;  Laterality: Left;    Current Outpatient Medications  Medication Sig Dispense Refill  . pantoprazole (PROTONIX) 20 MG tablet Take 1 tablet (20 mg total) by mouth daily. 30 tablet 0  . SYNTHROID 100 MCG tablet TAKE 1 TABLET BY MOUTH EVERY DAY BEFORE BREAKFAST 30 tablet 4   No current facility-administered medications for this visit.     Allergies as of 02/23/2018  . (No Known Allergies)    Family History  Problem Relation Age of Onset  . Hypertension Mother   . Diabetes Mother   . Hypertension Father   . Diabetes Father     Social History   Socioeconomic History  . Marital status: Married    Spouse name: Not on file  . Number of children: Not on file  . Years of education: Not on file  . Highest education level: Not on file  Occupational History  . Not on file  Social Needs  . Financial resource strain: Not on file  . Food insecurity:    Worry: Not on file    Inability: Not on file  . Transportation needs:    Medical: Not on file    Non-medical: Not on file  Tobacco Use  . Smoking status: Never Smoker  . Smokeless tobacco: Never Used  Substance and Sexual Activity  . Alcohol use: No  . Drug use: No  . Sexual activity: Not on file  Lifestyle  .  Physical activity:    Days per week: Not on file    Minutes per session: Not on file  . Stress: Not on file  Relationships  . Social connections:    Talks on phone: Not on file    Gets together: Not on file    Attends religious service: Not on file    Active member of club or organization: Not on file    Attends meetings of clubs or organizations: Not on file    Relationship status: Not on file  . Intimate partner violence:    Fear of current or ex partner: Not on file    Emotionally abused: Not on file    Physically abused: Not on file    Forced sexual activity: Not on file  Other Topics Concern  . Not on file  Social History Narrative   . Not on file     Physical Exam: BP 116/68   Pulse 74   Ht 5' 7.72" (1.72 m)   Wt 214 lb 4 oz (97.2 kg)   BMI 32.85 kg/m  Constitutional: generally well-appearing Psychiatric: alert and oriented x3 Eyes: extraocular movements intact Mouth: oral pharynx moist, no lesions Neck: supple no lymphadenopathy Cardiovascular: heart regular rate and rhythm Lungs: clear to auscultation bilaterally Abdomen: soft, nontender, nondistended, no obvious ascites, no peritoneal signs, normal bowel sounds; I did not detect any asymmetry in his abdomen Extremities: no lower extremity edema bilaterally Skin: no lesions on visible extremities   Assessment and plan: 32 y.o. male with epigastric pain, left lower quadrant pain, alternating bowel habits.  I think his symptoms are very likely functional, perhaps mild IBS.  He does have some alternating bowel habits and I am going to try to correct that with daily fiber supplement.  Some of his symptoms especially pyrosis and epigastric pain may be related to GERD and so he will stay on this Protonix at 20 mg strength shortly before breakfast meal every day.  Lastly I will arrange for CAT scan of his abdomen and pelvis make sure we are not missing anything more serious, he does feel he has a lump in his left upper quadrant however I could not really detect this on exam today.  If the testing above is not helpful and is still having a lot of symptoms despite daily proton pump inhibitor and fiber supplements that he may need further testing such as an upper endoscopy.    Please see the "Patient Instructions" section for addition details about the plan.   Rob Bunting, MD [facility] 02/23/2018, 10:05 AM  Cc: Sharlene Dory*

## 2018-03-01 ENCOUNTER — Ambulatory Visit (INDEPENDENT_AMBULATORY_CARE_PROVIDER_SITE_OTHER)
Admission: RE | Admit: 2018-03-01 | Discharge: 2018-03-01 | Disposition: A | Discharge status: Home / Self Care | Payer: PPO | Source: Ambulatory Visit | Attending: Gastroenterology | Admitting: Gastroenterology

## 2018-03-01 DIAGNOSIS — R1013 Epigastric pain: Secondary | ICD-10-CM

## 2018-03-01 MED ORDER — IOPAMIDOL (ISOVUE-300) INJECTION 61%
100.0000 mL | Freq: Once | INTRAVENOUS | Status: AC | PRN
  Administered 2018-03-01: 100 mL via INTRAVENOUS

## 2018-03-03 ENCOUNTER — Ambulatory Visit (INDEPENDENT_AMBULATORY_CARE_PROVIDER_SITE_OTHER): Payer: PPO | Admitting: Family Medicine

## 2018-03-03 ENCOUNTER — Encounter: Payer: Self-pay | Admitting: Family Medicine

## 2018-03-03 VITALS — BP 108/68 | HR 65 | Temp 98.5°F | Ht 69.5 in | Wt 214.2 lb

## 2018-03-03 DIAGNOSIS — E876 Hypokalemia: Secondary | ICD-10-CM

## 2018-03-03 DIAGNOSIS — R109 Unspecified abdominal pain: Secondary | ICD-10-CM

## 2018-03-03 DIAGNOSIS — R739 Hyperglycemia, unspecified: Secondary | ICD-10-CM | POA: Diagnosis not present

## 2018-03-03 LAB — URINALYSIS
Bilirubin Urine: NEGATIVE
HGB URINE DIPSTICK: NEGATIVE
Ketones, ur: NEGATIVE
Leukocytes, UA: NEGATIVE
NITRITE: NEGATIVE
Specific Gravity, Urine: 1.015 (ref 1.000–1.030)
TOTAL PROTEIN, URINE-UPE24: NEGATIVE
Urine Glucose: NEGATIVE
Urobilinogen, UA: 0.2 (ref 0.0–1.0)
pH: 7 (ref 5.0–8.0)

## 2018-03-03 LAB — BASIC METABOLIC PANEL
BUN: 15 mg/dL (ref 6–23)
CHLORIDE: 102 meq/L (ref 96–112)
CO2: 30 mEq/L (ref 19–32)
CREATININE: 0.93 mg/dL (ref 0.40–1.50)
Calcium: 9.8 mg/dL (ref 8.4–10.5)
GFR: 99.68 mL/min (ref >60.00)
Glucose, Bld: 87 mg/dL (ref 70–99)
Potassium: 4 mEq/L (ref 3.5–5.1)
Sodium: 139 mEq/L (ref 135–145)

## 2018-03-03 LAB — HEMOGLOBIN A1C: Hgb A1c MFr Bld: 5.5 % (ref 4.6–6.5)

## 2018-03-03 MED ORDER — CYCLOBENZAPRINE HCL 10 MG PO TABS
10.0000 mg | ORAL_TABLET | Freq: Three times a day (TID) | ORAL | 0 refills | Status: DC | PRN
Start: 2018-03-03 — End: 2018-03-19

## 2018-03-03 MED ORDER — MELOXICAM 15 MG PO TABS: 15.0000 mg | ORAL_TABLET | Freq: Every day | ORAL | 0 refills | Status: DC

## 2018-03-03 NOTE — Progress Notes (Signed)
Musculoskeletal Exam  Patient: Timothy Austin DOB: Jul 30, 1986  DOS: 03/03/2018  SUBJECTIVE:  Chief Complaint:   Chief Complaint  Patient presents with  . Back Pain    Timothy Austin is a 32 y.o.  male for evaluation and treatment of L side pain.   Onset:  4 days ago.  No inj or change in activity.  Location: L lower back area Character:  sharp and tingling  Progression of issue:  has worsened Associated symptoms: none Treatment: to date has been OTC NSAIDS.   Neurovascular symptoms: no  ROS: Musculoskeletal/Extremities: +L low back pain  Past Medical History:  Diagnosis Date  . Dizziness   . Hypercholesteremia   . Osteochondritis dissecans of left knee   . Thyroid disease   . Wears glasses     Objective: VITAL SIGNS: BP 108/68 (BP Location: Left Arm, Patient Position: Sitting, Cuff Size: Large)   Pulse 65   Temp 98.5 F (36.9 C) (Oral)   Ht 5' 9.5" (1.765 m)   Wt 214 lb 4 oz (97.2 kg)   SpO2 97%   BMI 31.19 kg/m  Constitutional: Well formed, well developed. No acute distress. Cardiovascular: Brisk cap refill Thorax & Lungs: No accessory muscle use Musculoskeletal: LBP.   Tenderness to palpation: yes Deformity: no Ecchymosis: no Tests positive: none Tests negative: Straight leg Neurologic: Normal sensory function. No focal deficits noted. DTR's equal and symmetry in LE's. No clonus. Psychiatric: Normal mood. Age appropriate judgment and insight. Alert & oriented x 3.    Assessment:  Flank pain - Plan: POCT UA - Glucose/Protein, cyclobenzaprine (FLEXERIL) 10 MG tablet, meloxicam (MOBIC) 15 MG tablet  Hypokalemia - Plan: Basic metabolic panel  Hyperglycemia - Plan: Hemoglobin A1c  Plan: Orders as above. Tx for msk etiology, f/u on labs from ED.  Heat, ice, stretches/exercises given. F/u prn. The patient voiced understanding and agreement to the plan.   Jilda Rocheicholas Paul PomonaWendling, DO 03/03/18  12:04 PM

## 2018-03-03 NOTE — Patient Instructions (Addendum)

## 2018-03-03 NOTE — Addendum Note (Signed)
Addended by: Radene GunningWENDLING, NICHOLAS P on: 03/03/2018 12:07 PM   Modules accepted: Orders

## 2018-03-03 NOTE — Progress Notes (Signed)
Pre visit review using our clinic review tool, if applicable. No additional management support is needed unless otherwise documented below in the visit note. 

## 2018-03-17 ENCOUNTER — Ambulatory Visit: Payer: Self-pay | Admitting: *Deleted

## 2018-03-17 ENCOUNTER — Ambulatory Visit: Payer: Self-pay | Admitting: Family Medicine

## 2018-03-17 NOTE — Telephone Encounter (Signed)
Appointment scheduled 03/18/2018 with PCP

## 2018-03-17 NOTE — Telephone Encounter (Signed)
Attempted to contact pt regarding symptoms; left message on voicemail [phone]; unable to complete nurse triage at this time.

## 2018-03-17 NOTE — Telephone Encounter (Signed)
Patient called and stated that he has been dizzy two days ago and he states that he is always dizzy and hear a grinding noise in his ear and he states when he moves his head in any direction he has pressure inside his ears. He is concern and want to talk to a nurse   CB# [phone]  Reason for Disposition . Ear congestion present > 48 hours  Answer Assessment - Initial Assessment Questions 1. LOCATION: "Which ear is involved?"       Both ears- patient feels pressure 2. SENSATION: "Describe how the ear feels."      Pressure- with head movement 3. ONSET:  "When did the ear symptoms start?"       Started 2 days ago 4. PAIN: "Do you also have an earache?" If so, ask: "How bad is it?" (Scale 1-10; or mild, moderate, severe)     No pain 5. CAUSE: "What do you think is causing the ear congestion?"      unknown 6. URI: "Do you have a runny nose or cough?"      no 7. NASAL ALLERGIES: "Are there symptoms of hay fever, such as sneezing or a clear nasal discharge?"     No- humidity bothers him 8. PREGNANCY: "Is there any chance you are pregnant?" "When was your last menstrual period?"     n/a  Protocols used: EAR - CONGESTION-A-AH

## 2018-03-18 ENCOUNTER — Encounter: Payer: Self-pay | Admitting: Family Medicine

## 2018-03-18 ENCOUNTER — Ambulatory Visit (INDEPENDENT_AMBULATORY_CARE_PROVIDER_SITE_OTHER): Payer: PPO | Admitting: Family Medicine

## 2018-03-18 ENCOUNTER — Encounter (HOSPITAL_BASED_OUTPATIENT_CLINIC_OR_DEPARTMENT_OTHER): Payer: Self-pay | Admitting: Emergency Medicine

## 2018-03-18 ENCOUNTER — Other Ambulatory Visit: Payer: Self-pay

## 2018-03-18 VITALS — BP 114/82 | HR 69 | Temp 98.3°F | Ht 69.5 in | Wt 214.2 lb

## 2018-03-18 DIAGNOSIS — Z79899 Other long term (current) drug therapy: Secondary | ICD-10-CM | POA: Insufficient documentation

## 2018-03-18 DIAGNOSIS — H6983 Other specified disorders of Eustachian tube, bilateral: Secondary | ICD-10-CM

## 2018-03-18 DIAGNOSIS — K529 Noninfective gastroenteritis and colitis, unspecified: Secondary | ICD-10-CM | POA: Diagnosis not present

## 2018-03-18 DIAGNOSIS — H6591 Unspecified nonsuppurative otitis media, right ear: Secondary | ICD-10-CM

## 2018-03-18 DIAGNOSIS — R112 Nausea with vomiting, unspecified: Secondary | ICD-10-CM | POA: Diagnosis present

## 2018-03-18 MED ORDER — PREDNISONE 20 MG PO TABS: 40.0000 mg | ORAL_TABLET | Freq: Every day | ORAL | 0 refills | Status: AC

## 2018-03-18 NOTE — Progress Notes (Signed)
Chief Complaint  Patient presents with  . Ear Fullness  . Dizziness    Pt is here for bilateral ear pressure. Duration: 4 days Progression: unchanged Associated symptoms: dizziness/balance is off, hearing a whooshing Denies: sore throat, congestion, post nasal drip, coryza, sneezing and sinus pressure, bleeding, or discharge from ear Treatment to date: Tylenol  ROS:  HEENT: denies ear pain Costitutional: Denies fevers  Past Medical History:  Diagnosis Date  . Dizziness   . Hypercholesteremia   . Osteochondritis dissecans of left knee   . Thyroid disease   . Wears glasses     BP 114/82 (BP Location: Left Arm, Patient Position: Sitting, Cuff Size: Normal)   Pulse 69   Temp 98.3 F (36.8 C) (Oral)   Ht 5' 9.5" (1.765 m)   Wt 214 lb 4 oz (97.2 kg)   SpO2 98%   BMI 31.19 kg/m  General: Awake, alert, appearing stated age HEENT:  L ear- Canal patent without drainage or erythema, TM is neg R ear- canal patent without drainage or erythema, TM is mildly bulging, some serous fluid, no erythema Nose- nares patent and without discharge Mouth- Lips, gums and dentition unremarkable, pharynx is without erythema or exudate Neck: No adenopathy Lungs: Normal effort, no accessory muscle use Psych: Age appropriate judgment and insight, normal mood and affect  Dysfunction of both eustachian tubes - Plan: predniSONE (DELTASONE) 20 MG tablet  Fluid level behind tympanic membrane of right ear - Plan: predniSONE (DELTASONE) 20 MG tablet  Orders as above. INCS for next 3-4 weeks also.  F/u prn. Pt voiced understanding and agreement to the plan.  Jilda Rocheicholas Paul [place]ile[place]Wendling, DO 03/18/18 8:12 AM

## 2018-03-18 NOTE — Progress Notes (Signed)
Pre visit review using our clinic review tool, if applicable. No additional management support is needed unless otherwise documented below in the visit note. 

## 2018-03-18 NOTE — ED Triage Notes (Signed)
Pt is c/o nausea and vomiting that started around 11 am  Pt states he has felt feverish and had some low back pain  Pt adds he has had a little bit of diarrhea

## 2018-03-18 NOTE — Patient Instructions (Signed)
Consider using a nasal spray like Flonase for the next 3-4 weeks.  Let us know if anything changes.

## 2018-03-19 ENCOUNTER — Telehealth: Payer: Self-pay

## 2018-03-19 ENCOUNTER — Emergency Department (HOSPITAL_BASED_OUTPATIENT_CLINIC_OR_DEPARTMENT_OTHER)
Admission: EM | Admit: 2018-03-19 | Discharge: 2018-03-19 | Disposition: A | Discharge status: Home / Self Care | Payer: PPO | Attending: Emergency Medicine | Admitting: Emergency Medicine

## 2018-03-19 DIAGNOSIS — K529 Noninfective gastroenteritis and colitis, unspecified: Secondary | ICD-10-CM

## 2018-03-19 LAB — COMPREHENSIVE METABOLIC PANEL
ALT: 49 U/L — AB (ref 0–44)
AST: 29 U/L (ref 15–41)
Albumin: 4.8 g/dL (ref 3.5–5.0)
Alkaline Phosphatase: 58 U/L (ref 38–126)
Anion gap: 12 (ref 5–15)
BUN: 15 mg/dL (ref 6–20)
CHLORIDE: 100 mmol/L (ref 98–111)
CO2: 26 mmol/L (ref 22–32)
CREATININE: 0.99 mg/dL (ref 0.61–1.24)
Calcium: 9.1 mg/dL (ref 8.9–10.3)
GFR calc Af Amer: >60 mL/min (ref >60)
Glucose, Bld: 121 mg/dL — ABNORMAL HIGH (ref 70–99)
POTASSIUM: 3.5 mmol/L (ref 3.5–5.1)
Sodium: 138 mmol/L (ref 135–145)
Total Bilirubin: 0.7 mg/dL (ref 0.3–1.2)
Total Protein: 8.5 g/dL — ABNORMAL HIGH (ref 6.5–8.1)

## 2018-03-19 LAB — URINALYSIS, ROUTINE W REFLEX MICROSCOPIC
Bilirubin Urine: NEGATIVE
Glucose, UA: NEGATIVE mg/dL
KETONES UR: NEGATIVE mg/dL
LEUKOCYTES UA: NEGATIVE
NITRITE: NEGATIVE
PROTEIN: 30 mg/dL — AB
Specific Gravity, Urine: >1.03 — ABNORMAL HIGH (ref 1.005–1.030)
pH: 5.5 (ref 5.0–8.0)

## 2018-03-19 LAB — CBC
HCT: 43.1 % (ref 39.0–52.0)
Hemoglobin: 14.8 g/dL (ref 13.0–17.0)
MCH: 30 pg (ref 26.0–34.0)
MCHC: 34.3 g/dL (ref 30.0–36.0)
MCV: 87.4 fL (ref 78.0–100.0)
PLATELETS: 152 10*3/uL (ref 150–400)
RBC: 4.93 MIL/uL (ref 4.22–5.81)
RDW: 12.6 % (ref 11.5–15.5)
WBC: 8.2 10*3/uL (ref 4.0–10.5)

## 2018-03-19 LAB — URINALYSIS, MICROSCOPIC (REFLEX)

## 2018-03-19 LAB — LIPASE, BLOOD: LIPASE: 27 U/L (ref 11–51)

## 2018-03-19 MED ORDER — SODIUM CHLORIDE 0.9 % IV BOLUS
1000.0000 mL | Freq: Once | INTRAVENOUS | Status: AC
  Administered 2018-03-19: 1000 mL via INTRAVENOUS

## 2018-03-19 MED ORDER — ONDANSETRON 8 MG PO TBDP: 8.0000 mg | ORAL_TABLET | Freq: Three times a day (TID) | ORAL | 0 refills | Status: DC | PRN

## 2018-03-19 MED ORDER — ONDANSETRON HCL 4 MG/2ML IJ SOLN
4.0000 mg | Freq: Once | INTRAMUSCULAR | Status: AC
  Administered 2018-03-19: 4 mg via INTRAVENOUS
  Filled 2018-03-19: qty 2

## 2018-03-19 NOTE — ED Provider Notes (Signed)
[facility] Provider Note: Lowella Dell, MD, FACEP  CSN: [id number] MRN: [id number] ARRIVAL: 03/18/18 at 2333 ROOM: MH09/MH09   CHIEF COMPLAINT  Vomiting   HISTORY OF PRESENT ILLNESS  03/19/18 2:31 AM Timothy Austin is a 32 y.o. male with nausea and vomiting since yesterday morning.  The nausea and vomiting have been profuse.  He has had some intermittent epigastric pain which is usually relieved by the vomiting.  This pain is moderate.  He has also had some diarrhea which is not severe.  He feels lightheaded and dehydrated.  He has had a subjective fever and some low back pain.   Past Medical History:  Diagnosis Date  . Dizziness   . Hypercholesteremia   . Osteochondritis dissecans of left knee   . Thyroid disease   . Wears glasses     Past Surgical History:  Procedure Laterality Date  . APPENDECTOMY  age 1  . CHONDROPLASTY Left 03/06/2015   Procedure: CHONDROPLASTY;  Surgeon: Eugenia Mcalpine, MD;  [facility]: [facility];  Service: Orthopedics;  Laterality: Left;  . KNEE ARTHROSCOPY Left 03/06/2015   Procedure: ARTHROSCOPY KNEE DEBRIDEMENT  of OCD LESION  AND HARVEST CARTILAGE;  Surgeon: Eugenia Mcalpine, MD;  Location: [facility] [place];  Service: Orthopedics;  Laterality: Left;    Family History  Problem Relation Age of Onset  . Hypertension Mother   . Diabetes Mother   . Hypertension Father   . Diabetes Father     Social History   Tobacco Use  . Smoking status: Never Smoker  . Smokeless tobacco: Never Used  Substance Use Topics  . Alcohol use: No  . Drug use: No    Prior to Admission medications   Medication Sig Start Date End Date Taking? Authorizing Provider  acetaminophen (TYLENOL) 500 MG tablet Take 500 mg by mouth every 6 (six) hours as needed for mild pain.   Yes [provider]  pantoprazole (PROTONIX) 20 MG tablet Take 1 tablet (20 mg total) by mouth daily. 02/19/18  Yes Arby Barrette, MD  predniSONE  (DELTASONE) 20 MG tablet Take 2 tablets (40 mg total) by mouth daily with breakfast for 5 days. 03/18/18 03/23/18 Yes Wendling, Jilda Roche, DO  SYNTHROID 100 MCG tablet TAKE 1 TABLET BY MOUTH EVERY DAY BEFORE BREAKFAST 01/26/18  Yes Sharlene Dory, DO  cyclobenzaprine (FLEXERIL) 10 MG tablet Take 1 tablet (10 mg total) by mouth 3 (three) times daily as needed for muscle spasms. 03/03/18   Sharlene Dory, DO  meloxicam (MOBIC) 15 MG tablet Take 1 tablet (15 mg total) by mouth daily. 03/03/18   Sharlene Dory, DO    Allergies Patient has no known allergies.   REVIEW OF SYSTEMS  Negative except as noted here or in the History of Present Illness.   PHYSICAL EXAMINATION  Initial Vital Signs Blood pressure 121/83, pulse (!) 106, temperature 98.7 F (37.1 C), temperature source Oral, resp. rate 16, SpO2 96 %.  Examination General: Well-developed, well-nourished male in no acute distress; appearance consistent with age of record HENT: normocephalic; atraumatic Eyes: pupils equal, round and reactive to light; extraocular muscles intact Neck: supple Heart: regular rate and rhythm Lungs: clear to auscultation bilaterally Abdomen: soft; nondistended; nontender; no masses or hepatosplenomegaly; bowel sounds present Extremities: No deformity; full range of motion; pulses normal Neurologic: Awake, alert and oriented; motor function intact in all extremities and symmetric; no facial droop Skin: Warm and dry Psychiatric: Normal mood and affect   RESULTS  Summary of this visit's results, reviewed by myself:   EKG Interpretation  Date/Time:    Ventricular Rate:    PR Interval:    QRS Duration:   QT Interval:    QTC Calculation:   R Axis:     Text Interpretation:        Laboratory Studies: Results for orders placed or performed during the hospital encounter of 03/19/18 (from the past 24 hour(s))  Lipase, blood     Status: None   Collection Time: 03/19/18  2:12  AM  Result Value Ref Range   Lipase 27 11 - 51 U/L  Comprehensive metabolic panel     Status: Abnormal   Collection Time: 03/19/18  2:12 AM  Result Value Ref Range   Sodium 138 135 - 145 mmol/L   Potassium 3.5 3.5 - 5.1 mmol/L   Chloride 100 98 - 111 mmol/L   CO2 26 22 - 32 mmol/L   Glucose, Bld 121 (H) 70 - 99 mg/dL   BUN 15 6 - 20 mg/dL   Creatinine, Ser 1.19 0.61 - 1.24 mg/dL   Calcium 9.1 8.9 - 14.7 mg/dL   Total Protein 8.5 (H) 6.5 - 8.1 g/dL   Albumin 4.8 3.5 - 5.0 g/dL   AST 29 15 - 41 U/L   ALT 49 (H) 0 - 44 U/L   Alkaline Phosphatase 58 38 - 126 U/L   Total Bilirubin 0.7 0.3 - 1.2 mg/dL   GFR calc non Af Amer >60 >60 mL/min   GFR calc Af Amer >60 >60 mL/min   Anion gap 12 5 - 15  CBC     Status: None   Collection Time: 03/19/18  2:12 AM  Result Value Ref Range   WBC 8.2 4.0 - 10.5 K/uL   RBC 4.93 4.22 - 5.81 MIL/uL   Hemoglobin 14.8 13.0 - 17.0 g/dL   HCT 82.9 56.2 - 13.0 %   MCV 87.4 78.0 - 100.0 fL   MCH 30.0 26.0 - 34.0 pg   MCHC 34.3 30.0 - 36.0 g/dL   RDW 86.5 78.4 - 69.6 %   Platelets 152 150 - 400 K/uL  Urinalysis, Routine w reflex microscopic     Status: Abnormal   Collection Time: 03/19/18  3:58 AM  Result Value Ref Range   Color, Urine AMBER (A) YELLOW   APPearance CLEAR CLEAR   Specific Gravity, Urine >1.030 (H) 1.005 - 1.030   pH 5.5 5.0 - 8.0   Glucose, UA NEGATIVE NEGATIVE mg/dL   Hgb urine dipstick SMALL (A) NEGATIVE   Bilirubin Urine NEGATIVE NEGATIVE   Ketones, ur NEGATIVE NEGATIVE mg/dL   Protein, ur 30 (A) NEGATIVE mg/dL   Nitrite NEGATIVE NEGATIVE   Leukocytes, UA NEGATIVE NEGATIVE  Urinalysis, Microscopic (reflex)     Status: Abnormal   Collection Time: 03/19/18  3:58 AM  Result Value Ref Range   RBC / HPF 0-5 0 - 5 RBC/hpf   WBC, UA 0-5 0 - 5 WBC/hpf   Bacteria, UA FEW (A) NONE SEEN   Squamous Epithelial / LPF 0-5 0 - 5   Mucus PRESENT    Imaging Studies: No results found.  ED COURSE and MDM  Nursing notes and initial vitals  signs, including pulse oximetry, reviewed.  Vitals:   03/18/18 2345 03/19/18 0202 03/19/18 0401 03/19/18 0510  BP: 122/83 121/83 124/80 130/79  Pulse: (!) 109 (!) 106 (!) 102 (!) 101  Resp: 16 16 18 18   Temp: 98.5 F (36.9 C) 98.7 F (  37.1 C) 98.2 F (36.8 C)   TempSrc: Oral Oral Oral   SpO2: 99% 96% 99% 99%   5:31 AM Drinking fluids without emesis after IV fluid bolus and Zofran.  Feeling better.  PROCEDURES    ED DIAGNOSES     ICD-10-CM   1. Gastroenteritis K52.9        Becky Berberian, MD 03/19/18 [phone]

## 2018-03-19 NOTE — ED Notes (Signed)
Patient given water for po challenge 

## 2018-03-19 NOTE — Telephone Encounter (Signed)
ED follow up call made to patient. Left message for him to return call so that we may schedule an ED follow up visit with provider.

## 2018-03-30 ENCOUNTER — Other Ambulatory Visit: Payer: Self-pay | Admitting: Family Medicine

## 2018-03-30 DIAGNOSIS — R109 Unspecified abdominal pain: Secondary | ICD-10-CM

## 2018-05-04 ENCOUNTER — Encounter: Payer: Self-pay | Admitting: Gastroenterology

## 2018-05-04 ENCOUNTER — Ambulatory Visit (INDEPENDENT_AMBULATORY_CARE_PROVIDER_SITE_OTHER): Payer: PPO | Admitting: Gastroenterology

## 2018-05-04 VITALS — BP 114/64 | HR 69 | Ht 69.5 in | Wt 213.4 lb

## 2018-05-04 DIAGNOSIS — R1012 Left upper quadrant pain: Secondary | ICD-10-CM

## 2018-05-04 DIAGNOSIS — G8929 Other chronic pain: Secondary | ICD-10-CM

## 2018-05-04 NOTE — Patient Instructions (Addendum)
Call if you are feeling poorly again and want to proceed with EGD.  Normal BMI (Body Mass Index- based on height and weight) is between 19 and 25. Your BMI today is Body mass index is 31.06 kg/m. Marland Kitchen. Please consider follow up  regarding your BMI with your Primary Care Provider.

## 2018-05-04 NOTE — Progress Notes (Signed)
Review of pertinent gastrointestinal problems: 1. Abdominal pains. Blood tests 2019 normal CBC, slightly low potassium but otherwise normal complete metabolic profile.  H. pylori breath test October 2018 was negative a year prior it was checked and it was also negative.  Pancreatic enzymes were normal. Abdominal ultrasound June 2019 was "within normal limits". Ct scan abd/pelv with IV and oral contrast 02/2018: normal except for small left inguinal hernia containing fat only.   HPI: This is a very pleasant 32 year old man whom I last saw about 2 months ago  He took proton pump inhibitor daily for several weeks and then stopped taking it.  He was quite relieved to hear that the CT scan was normal and tells me that he felt quite a bit better just with that knowledge.  He was not is bothered by this left upper quadrant discomfort.  Still has LUQ feels a lump that is bothersome.  Feels gassiness at that time.    Chief complaint is left upper quadrant discomfort  ROS: complete GI ROS as described in HPI, all other review negative.  Constitutional:  No unintentional weight loss   Past Medical History:  Diagnosis Date  . Dizziness   . Hypercholesteremia   . Osteochondritis dissecans of left knee   . Thyroid disease   . Wears glasses     Past Surgical History:  Procedure Laterality Date  . APPENDECTOMY  age 32  . CHONDROPLASTY Left 03/06/2015   Procedure: CHONDROPLASTY;  Surgeon: Eugenia Mcalpineobert Collins, MD;  [facility] [place];  Service: Orthopedics;  Laterality: Left;  . KNEE ARTHROSCOPY Left 03/06/2015   Procedure: ARTHROSCOPY KNEE DEBRIDEMENT  of OCD LESION  AND HARVEST CARTILAGE;  Surgeon: Eugenia Mcalpineobert Collins, MD;  [facility] ;  Service: Orthopedics;  Laterality: Left;    Current Outpatient Medications  Medication Sig Dispense Refill  . SYNTHROID 100 MCG tablet TAKE 1 TABLET BY MOUTH EVERY DAY BEFORE BREAKFAST 30 tablet 4   No current  facility-administered medications for this visit.     Allergies as of 05/04/2018  . (No Known Allergies)    Family History  Problem Relation Age of Onset  . Hypertension Mother   . Diabetes Mother   . Hypertension Father   . Diabetes Father     Social History   Socioeconomic History  . Marital status: Married    Spouse name: Not on file  . Number of children: Not on file  . Years of education: Not on file  . Highest education level: Not on file  Occupational History  . Not on file  Social Needs  . Financial resource strain: Not on file  . Food insecurity:    Worry: Not on file    Inability: Not on file  . Transportation needs:    Medical: Not on file    Non-medical: Not on file  Tobacco Use  . Smoking status: Never Smoker  . Smokeless tobacco: Never Used  Substance and Sexual Activity  . Alcohol use: No  . Drug use: No  . Sexual activity: Not on file  Lifestyle  . Physical activity:    Days per week: Not on file    Minutes per session: Not on file  . Stress: Not on file  Relationships  . Social connections:    Talks on phone: Not on file    Gets together: Not on file    Attends religious service: Not on file    Active member of club or organization: Not on file  Attends meetings of clubs or organizations: Not on file    Relationship status: Not on file  . Intimate partner violence:    Fear of current or ex partner: Not on file    Emotionally abused: Not on file    Physically abused: Not on file    Forced sexual activity: Not on file  Other Topics Concern  . Not on file  Social History Narrative  . Not on file     Physical Exam: BP 114/64   Pulse 69   Ht 5' 9.5" (1.765 m)   Wt 213 lb 6.4 oz (96.8 kg)   BMI 31.06 kg/m  Constitutional: generally well-appearing Psychiatric: alert and oriented x3 Abdomen: soft, nontender, nondistended, no obvious ascites, no peritoneal signs, normal bowel sounds No peripheral edema noted in lower  extremities  Assessment and plan: 32 y.o. male with left upper quadrant discomfort  I think he may be feeling some minor intermittent trapped gas.  I tried to reassure him that there is nothing serious going on given his overall clinical situation with normal labs, normal ultrasound, normal imaging CT scan.  I did tell him that the next step in evaluation would generally be an upper endoscopy but I do not feel that he necessarily had to have it.  He was relieved to hear all this and is not too concerned just now that he would want to go ahead with an upper endoscopy.  He will call if he becomes very bothered by this symptom again and at that time I would likely proceed with upper endoscopy.  Please see the "Patient Instructions" section for addition details about the plan.  Rob Bunting, MD [facility] 05/04/2018, 10:15 AM

## 2018-05-20 ENCOUNTER — Encounter: Payer: Self-pay | Admitting: Family Medicine

## 2018-05-20 ENCOUNTER — Ambulatory Visit (INDEPENDENT_AMBULATORY_CARE_PROVIDER_SITE_OTHER): Payer: PPO | Admitting: Family Medicine

## 2018-05-20 VITALS — BP 120/70 | HR 66 | Temp 97.8°F | Ht 70.0 in | Wt 212.5 lb

## 2018-05-20 DIAGNOSIS — E039 Hypothyroidism, unspecified: Secondary | ICD-10-CM

## 2018-05-20 DIAGNOSIS — R071 Chest pain on breathing: Secondary | ICD-10-CM

## 2018-05-20 DIAGNOSIS — E785 Hyperlipidemia, unspecified: Secondary | ICD-10-CM | POA: Diagnosis not present

## 2018-05-20 DIAGNOSIS — R0789 Other chest pain: Secondary | ICD-10-CM

## 2018-05-20 LAB — LIPID PANEL
CHOL/HDL RATIO: 7
Cholesterol: 208 mg/dL — ABNORMAL HIGH (ref 0–200)
HDL: 31.6 mg/dL — ABNORMAL LOW (ref >39.00)
LDL Cholesterol: 162 mg/dL — ABNORMAL HIGH (ref 0–99)
NonHDL: 176.17
Triglycerides: 69 mg/dL (ref 0.0–149.0)
VLDL: 13.8 mg/dL (ref 0.0–40.0)

## 2018-05-20 LAB — T4, FREE: Free T4: 1.01 ng/dL (ref 0.60–1.60)

## 2018-05-20 LAB — TSH: TSH: 5.48 u[IU]/mL — ABNORMAL HIGH (ref 0.35–4.50)

## 2018-05-20 NOTE — Patient Instructions (Signed)
Give [place] 2-3 business days to get the results of your labs back.   Stretch your pectoral muscles.  Ice may be helpful also.  Try to see if there is a trigger or association that causes your chest discomfort.   Let us know if you need anything.

## 2018-05-20 NOTE — Progress Notes (Signed)
Chief Complaint  Patient presents with  . Follow-up    Subjective: Patient is a 32 y.o. male here for thyroid ck.  Hypothyroidism Patient presents for follow-up of hypothyroidism.  Reports compliance with medication- Synthroid 100 mcg/d. Current symptoms include: denies fatigue, weight changes, heat/cold intolerance, bowel/skin changes or CVS symptoms He believes his dose should be unchanged  Dyslipidemia Pt has cut out table sugar form his diet. Diet is healthy overall. Walking daily. Not on any meds. No current CP or sob.  For nearly 17 yrs, pt has had tenderness over his chest, more pronounced on the L when it is touched. No inj or change in activity. He does not lift weights. He sleeps on his back, no recent change in mattresses.   ROS: Heart: Denies palpitations Lungs: Denies SOB   Past Medical History:  Diagnosis Date  . Dizziness   . Hypercholesteremia   . Osteochondritis dissecans of left knee   . Thyroid disease   . Wears glasses     Objective: BP 120/70 (BP Location: Left Arm, Patient Position: Sitting, Cuff Size: Normal)   Pulse 66   Temp 97.8 F (36.6 C) (Oral)   Ht 5\' 10"  (1.778 m)   Wt 212 lb 8 oz (96.4 kg)   SpO2 97%   BMI 30.49 kg/m  General: Awake, appears stated age HEENT: MMM, EOMi Neck: No thyromegaly or nodules noted, symmetric Heart: RRR, no LE edema, no bruits Lungs: CTAB, no rales, wheezes or rhonchi. No accessory muscle use MSK: +TTP over costochondral junction near R5 b/l Skin: no skin lesions on chest, no crepitus or erythema Psych: Age appropriate judgment and insight, normal affect and mood  Assessment and Plan: Hypothyroidism, unspecified type - Plan: TSH, T4, free  Dyslipidemia - Plan: Lipid panel  Costochondral chest pain  #1 ck labs, con Synthroid #2- ck labs, counseled on diet/exercise #3- likely component of costochondritis. Stretch pecs, ice, if no improvement will send ot sports med for potential [place] and eval.  F/u in 6  mo for CPE. The patient voiced understanding and agreement to the plan.  Jilda Roche [place], DO 05/20/18  9:19 AM

## 2018-05-20 NOTE — Progress Notes (Signed)
Pre visit review using our clinic review tool, if applicable. No additional management support is needed unless otherwise documented below in the visit note. 

## 2018-05-21 ENCOUNTER — Other Ambulatory Visit: Payer: Self-pay | Admitting: Family Medicine

## 2018-05-21 DIAGNOSIS — E785 Hyperlipidemia, unspecified: Secondary | ICD-10-CM

## 2018-06-04 ENCOUNTER — Ambulatory Visit: Payer: Self-pay

## 2018-06-04 NOTE — Telephone Encounter (Signed)
Patient called in with c/o "upper back pain." He says "the pain started about 4-5 days ago to the upper back at the left side near the shoulder. The pain is a 7 right now, but has been as high as 7-9. I have used a pain patch OTC, but no other medications." I asked about neurologic symptoms and other symptoms, he denies. According to protocol, see PCP within 3 days. Patient says he would like to be seen in the office today, I advised no availability today with PCP or any provider in the practice, advised availability on Monday or he can go to the Saturday clinic, he says he will go to the Saturday clinic, appointment scheduled for tomorrow at 1045 with Dr. Birdie Sons at [facility], care advice given, patient verbalized understanding.   Reason for Disposition . [1] MODERATE back pain (e.g., interferes with normal activities) AND [2] present > 3 days  Answer Assessment - Initial Assessment Questions 1. ONSET: "When did the pain begin?"      4-5 days ago 2. LOCATION: "Where does it hurt?" (upper, mid or lower back)     Upper towards left shoulder blade 3. SEVERITY: "How bad is the pain?"  (e.g., Scale 1-10; mild, moderate, or severe)   - MILD (1-3): doesn't interfere with normal activities    - MODERATE (4-7): interferes with normal activities or awakens from sleep    - SEVERE (8-10): excruciating pain, unable to do any normal activities      9 sometimes; right now a 7 4. PATTERN: "Is the pain constant?" (e.g., yes, no; constant, intermittent)      Constant 5. RADIATION: "Does the pain shoot into your legs or elsewhere?"     No 6. CAUSE:  "What do you think is causing the back pain?"      I don't know 7. BACK OVERUSE:  "Any recent lifting of heavy objects, strenuous work or exercise?"     Nothing unsual 8. MEDICATIONS: "What have you taken so far for the pain?" (e.g., nothing, acetaminophen, NSAIDS)     Medication patch (OTC) 9. NEUROLOGIC SYMPTOMS: "Do you have any weakness, numbness, or  problems with bowel/bladder control?"     No 10. OTHER SYMPTOMS: "Do you have any other symptoms?" (e.g., fever, abdominal pain, burning with urination, blood in urine)      No, but sometimes hard to turn head to the left side 11. PREGNANCY: "Is there any chance you are pregnant?" (e.g., yes, no; LMP)       N/A  Protocols used: BACK PAIN-A-AH

## 2018-06-05 ENCOUNTER — Encounter: Payer: Self-pay | Admitting: Family Medicine

## 2018-06-05 ENCOUNTER — Ambulatory Visit (INDEPENDENT_AMBULATORY_CARE_PROVIDER_SITE_OTHER): Payer: PPO | Admitting: Family Medicine

## 2018-06-05 DIAGNOSIS — M546 Pain in thoracic spine: Secondary | ICD-10-CM | POA: Diagnosis not present

## 2018-06-05 MED ORDER — CYCLOBENZAPRINE HCL 10 MG PO TABS: 10.0000 mg | ORAL_TABLET | Freq: Three times a day (TID) | ORAL | 0 refills | Status: DC | PRN

## 2018-06-05 MED ORDER — IBUPROFEN 600 MG PO TABS: 600.0000 mg | ORAL_TABLET | Freq: Three times a day (TID) | ORAL | 0 refills | Status: DC | PRN

## 2018-06-05 NOTE — Assessment & Plan Note (Signed)
Patient with periscapular thoracic back pain.  Suspect muscular strain.  Has improved slightly.  Advised rest.  We will trial ibuprofen as outlined below.  Trial of Flexeril as well.  Discussed possible drowsiness with Flexeril and to avoid driving when taking this.  He is given return precautions.  If not improving over the next week he will be reevaluated.

## 2018-06-05 NOTE — Patient Instructions (Signed)
Nice to meet you. You have likely strained a muscle.  We will treat you with ibuprofen.  You should take this with food.  It can cause upset stomach.  You can also take the Flexeril.  This potentially could make you drowsy so please do not drive when taking it. If you develop worsening symptoms please seek medical attention.  If your symptoms are not improving please follow-up.

## 2018-06-05 NOTE — Progress Notes (Signed)
  Marikay Alar, MD Phone: [phone]  Timothy Austin is a 32 y.o. male who presents today for same-day visit.  CC: Upper back pain  Patient notes symptoms started about 2 weeks ago.  He has left periscapular back pain just medial to his scapula.  Does note some discomfort into his bilateral trapezius muscles as well.  He notes that the sharp pain at times.  Previously he had discomfort on rotation of his neck to the left.  He notes no injury.  No radiation down his arms or legs.  No numbness or weakness.  He does feel like his shoulders get tired more quickly than previously.  Some associated pain with that.  He does note he feels better today.  He did see a chiropractor with no benefit.  He has not tried any medications.  Social History   Tobacco Use  Smoking Status Never Smoker  Smokeless Tobacco Never Used     ROS see history of present illness  Objective  Physical Exam Vitals:   06/05/18 1032  BP: 110/82  Pulse: (!) 58  Temp: 97.6 F (36.4 C)  SpO2: 98%    BP Readings from Last 3 Encounters:  06/05/18 110/82  05/20/18 120/70  05/04/18 114/64   Wt Readings from Last 3 Encounters:  06/05/18 217 lb (98.4 kg)  05/20/18 212 lb 8 oz (96.4 kg)  05/04/18 213 lb 6.4 oz (96.8 kg)    Physical Exam  Constitutional: No distress.  Cardiovascular: Normal rate, regular rhythm and normal heart sounds.  Pulmonary/Chest: Effort normal and breath sounds normal.  Musculoskeletal: He exhibits no edema.  No midline spine tenderness, no midline spine step-off, there is no muscular back or neck tenderness  Neurological: He is alert.  5/5 strength in bilateral biceps, triceps, grip, quads, hamstrings, plantar and dorsiflexion, sensation to light touch intact in bilateral UE and LE, normal gait  Skin: Skin is warm and dry. He is not diaphoretic.     Assessment/Plan: Please see individual problem list.  Thoracic back pain Patient with periscapular thoracic back pain.  Suspect  muscular strain.  Has improved slightly.  Advised rest.  We will trial ibuprofen as outlined below.  Trial of Flexeril as well.  Discussed possible drowsiness with Flexeril and to avoid driving when taking this.  He is given return precautions.  If not improving over the next week he will be reevaluated.   No orders of the defined types were placed in this encounter.   Meds ordered this encounter  Medications  . cyclobenzaprine (FLEXERIL) 10 MG tablet    Sig: Take 1 tablet (10 mg total) by mouth 3 (three) times daily as needed for muscle spasms.    Dispense:  15 tablet    Refill:  0  . ibuprofen (ADVIL,MOTRIN) 600 MG tablet    Sig: Take 1 tablet (600 mg total) by mouth every 8 (eight) hours as needed for mild pain.    Dispense:  30 tablet    Refill:  0     Marikay Alar, MD [facility]

## 2018-06-26 ENCOUNTER — Other Ambulatory Visit: Payer: Self-pay | Admitting: Family Medicine

## 2018-07-03 ENCOUNTER — Other Ambulatory Visit: Payer: Self-pay

## 2018-07-03 ENCOUNTER — Emergency Department (HOSPITAL_BASED_OUTPATIENT_CLINIC_OR_DEPARTMENT_OTHER): Payer: PPO

## 2018-07-03 ENCOUNTER — Encounter (HOSPITAL_BASED_OUTPATIENT_CLINIC_OR_DEPARTMENT_OTHER): Payer: Self-pay | Admitting: Emergency Medicine

## 2018-07-03 ENCOUNTER — Emergency Department (HOSPITAL_BASED_OUTPATIENT_CLINIC_OR_DEPARTMENT_OTHER)
Admission: EM | Admit: 2018-07-03 | Discharge: 2018-07-03 | Disposition: A | Discharge status: Home / Self Care | Payer: PPO | Attending: Emergency Medicine | Admitting: Emergency Medicine

## 2018-07-03 DIAGNOSIS — E039 Hypothyroidism, unspecified: Secondary | ICD-10-CM | POA: Diagnosis not present

## 2018-07-03 DIAGNOSIS — B349 Viral infection, unspecified: Secondary | ICD-10-CM | POA: Diagnosis not present

## 2018-07-03 DIAGNOSIS — M791 Myalgia, unspecified site: Secondary | ICD-10-CM | POA: Diagnosis present

## 2018-07-03 DIAGNOSIS — R1084 Generalized abdominal pain: Secondary | ICD-10-CM | POA: Insufficient documentation

## 2018-07-03 DIAGNOSIS — Z79899 Other long term (current) drug therapy: Secondary | ICD-10-CM | POA: Diagnosis not present

## 2018-07-03 LAB — CBC WITH DIFFERENTIAL/PLATELET
Abs Immature Granulocytes: 0.02 10*3/uL (ref 0.00–0.07)
BASOS ABS: 0 10*3/uL (ref 0.0–0.1)
Basophils Relative: 1 %
EOS ABS: 0 10*3/uL (ref 0.0–0.5)
Eosinophils Relative: 0 %
HEMATOCRIT: 44.1 % (ref 39.0–52.0)
HEMOGLOBIN: 14.2 g/dL (ref 13.0–17.0)
IMMATURE GRANULOCYTES: 0 %
LYMPHS PCT: 21 %
Lymphs Abs: 1.6 10*3/uL (ref 0.7–4.0)
MCH: 28.7 pg (ref 26.0–34.0)
MCHC: 32.2 g/dL (ref 30.0–36.0)
MCV: 89.3 fL (ref 80.0–100.0)
Monocytes Absolute: 0.4 10*3/uL (ref 0.1–1.0)
Monocytes Relative: 5 %
NEUTROS PCT: 73 %
NRBC: 0 % (ref 0.0–0.2)
Neutro Abs: 5.7 10*3/uL (ref 1.7–7.7)
Platelets: 196 10*3/uL (ref 150–400)
RBC: 4.94 MIL/uL (ref 4.22–5.81)
RDW: 12.1 % (ref 11.5–15.5)
WBC: 7.8 10*3/uL (ref 4.0–10.5)

## 2018-07-03 LAB — URINALYSIS, MICROSCOPIC (REFLEX)

## 2018-07-03 LAB — COMPREHENSIVE METABOLIC PANEL
ALT: 36 U/L (ref 0–44)
ANION GAP: 9 (ref 5–15)
AST: 23 U/L (ref 15–41)
Albumin: 4.9 g/dL (ref 3.5–5.0)
Alkaline Phosphatase: 55 U/L (ref 38–126)
BILIRUBIN TOTAL: 0.6 mg/dL (ref 0.3–1.2)
BUN: 13 mg/dL (ref 6–20)
CO2: 28 mmol/L (ref 22–32)
Calcium: 9.8 mg/dL (ref 8.9–10.3)
Chloride: 102 mmol/L (ref 98–111)
Creatinine, Ser: 1.03 mg/dL (ref 0.61–1.24)
Glucose, Bld: 113 mg/dL — ABNORMAL HIGH (ref 70–99)
POTASSIUM: 4.7 mmol/L (ref 3.5–5.1)
Sodium: 139 mmol/L (ref 135–145)
TOTAL PROTEIN: 8.7 g/dL — AB (ref 6.5–8.1)

## 2018-07-03 LAB — URINALYSIS, ROUTINE W REFLEX MICROSCOPIC
Bilirubin Urine: NEGATIVE
Glucose, UA: NEGATIVE mg/dL
KETONES UR: NEGATIVE mg/dL
LEUKOCYTES UA: NEGATIVE
NITRITE: NEGATIVE
PH: 6 (ref 5.0–8.0)
Protein, ur: NEGATIVE mg/dL
Specific Gravity, Urine: 1.025 (ref 1.005–1.030)

## 2018-07-03 LAB — LIPASE, BLOOD: Lipase: 35 U/L (ref 11–51)

## 2018-07-03 MED ORDER — FAMOTIDINE 20 MG PO TABS: 20.0000 mg | ORAL_TABLET | Freq: Two times a day (BID) | ORAL | 0 refills | Status: DC

## 2018-07-03 MED ORDER — ACETAMINOPHEN 500 MG PO TABS: 1000.0000 mg | ORAL_TABLET | Freq: Four times a day (QID) | ORAL | 0 refills | Status: DC | PRN

## 2018-07-03 MED ORDER — ONDANSETRON 4 MG PO TBDP: 4.0000 mg | ORAL_TABLET | ORAL | 0 refills | Status: DC | PRN

## 2018-07-03 NOTE — Discharge Instructions (Signed)
1.  At this time, with generalized symptoms including body aches, general abdominal pain, nausea and back pain, I suspect your illness is due to a virus. 2.  Try symptomatic treatment of Pepcid twice daily for 7 to 14 days, Zofran every 4 hours if you are having nausea, acetaminophen every 6 hours for body aches for the next 3 to 4 days.  Get extra rest and drink a lot of fluids.  Follow-up with your doctor for recheck in 3 to 4 days. 3.  Return to the emergency department if you are getting additional symptoms, your symptoms are worsening or other concerning symptoms develop.

## 2018-07-03 NOTE — ED Triage Notes (Signed)
Body aches x 2 days , denies cough. Has nausea and has been taking Zofran.

## 2018-07-03 NOTE — ED Provider Notes (Signed)
[facility] EMERGENCY DEPARTMENT Provider Note   CSN: [id number] Arrival date & time: 07/03/18  0725     History   Chief Complaint Chief Complaint  Patient presents with  . Generalized Body Aches    HPI Timothy Austin is a 32 y.o. male.  HPI Patient reports he has had generalized body aches for about 2 days now.  He reports he started with some lower back pain that was aching in quality.  He felt that he was also aching in the back of his legs.  No difficulty urinating.  He has noted some possible frequency.  No blood in the urine.  He reports he is felt like he has had some chills but has not documented a fever.  He states he is also had some nausea and discomfort in his upper abdomen.  He reports he has been having some increased frequency of bowel movement.  No vomiting.  Minimal amount of cough.  No sore throat or significant nasal congestion.  Patient denies he has been around anybody sick.  He reports that he has not had any travel in greater than a year.  Patient is in a long-term monogamous relationship with his wife with out risk factors for STD. Past Medical History:  Diagnosis Date  . Dizziness   . Hypercholesteremia   . Osteochondritis dissecans of left knee   . Thyroid disease   . Wears glasses     Patient Active Problem List   Diagnosis Date Noted  . Thoracic back pain 06/05/2018  . Costochondral chest pain 05/20/2018  . Dyslipidemia 05/20/2018  . Hypothyroidism 11/18/2017  . Wellness examination 09/21/2015  . S/P left knee arthroscopy 03/06/2015    Past Surgical History:  Procedure Laterality Date  . APPENDECTOMY  age 6  . CHONDROPLASTY Left 03/06/2015   Procedure: CHONDROPLASTY;  Surgeon: Eugenia Mcalpine, MD;  Location: [facility];  Service: Orthopedics;  Laterality: Left;  . KNEE ARTHROSCOPY Left 03/06/2015   Procedure: ARTHROSCOPY KNEE DEBRIDEMENT  of OCD LESION  AND HARVEST CARTILAGE;  Surgeon: Eugenia Mcalpine, MD;  Location:  [facility];  Service: Orthopedics;  Laterality: Left;        Home Medications    Prior to Admission medications   Medication Sig Start Date End Date Taking? Authorizing Provider  ondansetron (ZOFRAN) 4 MG tablet Take 4 mg by mouth every 8 (eight) hours as needed for nausea or vomiting.   Yes [provider]  SYNTHROID 100 MCG tablet TAKE 1 TABLET BY MOUTH EVERY DAY BEFORE BREAKFAST 06/28/18  Yes Wendling, Jilda Roche, DO  acetaminophen (TYLENOL) 500 MG tablet Take 2 tablets (1,000 mg total) by mouth every 6 (six) hours as needed. 07/03/18   Arby Barrette, MD  cyclobenzaprine (FLEXERIL) 10 MG tablet Take 1 tablet (10 mg total) by mouth 3 (three) times daily as needed for muscle spasms. 06/05/18   Glori Luis, MD  famotidine (PEPCID) 20 MG tablet Take 1 tablet (20 mg total) by mouth 2 (two) times daily. 07/03/18   Arby Barrette, MD  ibuprofen (ADVIL,MOTRIN) 600 MG tablet Take 1 tablet (600 mg total) by mouth every 8 (eight) hours as needed for mild pain. 06/05/18   Glori Luis, MD  ondansetron (ZOFRAN ODT) 4 MG disintegrating tablet Take 1 tablet (4 mg total) by mouth every 4 (four) hours as needed for nausea or vomiting. 07/03/18   Arby Barrette, MD    Family History Family History  Problem Relation Age of Onset  .  Hypertension Mother   . Diabetes Mother   . Hypertension Father   . Diabetes Father     Social History Social History   Tobacco Use  . Smoking status: Never Smoker  . Smokeless tobacco: Never Used  Substance Use Topics  . Alcohol use: No  . Drug use: No     Allergies   Patient has no known allergies.   Review of Systems Review of Systems 10 Systems reviewed and are negative for acute change except as noted in the HPI.   Physical Exam Updated Vital Signs BP (!) 134/98 (BP Location: Left Arm)   Pulse 98   Temp 99.4 F (37.4 C) (Oral)   Resp 18   Ht 5' 5.5" (1.664 m)   Wt 98.4 kg   SpO2 98%   BMI 35.56  kg/m   Physical Exam  Constitutional: He is oriented to person, place, and time. He appears well-developed and well-nourished. No distress.  HENT:  Head: Normocephalic and atraumatic.  Bilateral TMs normal.  Oropharynx widely patent.  No erythema or exudate.  Eyes: Pupils are equal, round, and reactive to light.  Neck: Neck supple.  Cardiovascular: Normal rate, regular rhythm, normal heart sounds and intact distal pulses.  Pulmonary/Chest: Effort normal and breath sounds normal.  Abdominal:  Abdomen is soft.  Mild to moderate epigastric and bilateral upper quadrant tenderness without guarding.  Lower abdomen is soft and nontender.  No CVA tenderness.  Musculoskeletal: Normal range of motion. He exhibits no edema or tenderness.  Neurological: He is alert and oriented to person, place, and time. He exhibits normal muscle tone. Coordination normal.  Skin: Skin is warm and dry.  Psychiatric: He has a normal mood and affect.     ED Treatments / Results  Labs (all labs ordered are listed, but only abnormal results are displayed) Labs Reviewed  COMPREHENSIVE METABOLIC PANEL - Abnormal; Notable for the following components:      Result Value   Glucose, Bld 113 (*)    Total Protein 8.7 (*)    All other components within normal limits  URINALYSIS, ROUTINE W REFLEX MICROSCOPIC - Abnormal; Notable for the following components:   Hgb urine dipstick TRACE (*)    All other components within normal limits  URINALYSIS, MICROSCOPIC (REFLEX) - Abnormal; Notable for the following components:   Bacteria, UA MANY (*)    All other components within normal limits  LIPASE, BLOOD  CBC WITH DIFFERENTIAL/PLATELET    EKG None  Radiology Dg Abd Acute W/chest  Result Date: 07/03/2018 CLINICAL DATA:  Body aches and nausea for 2 days.  Subjective fever. EXAM: DG ABDOMEN ACUTE W/ 1V CHEST COMPARISON:  Chest radiograph 02/19/2018. CT abdomen and pelvis 03/01/2018. FINDINGS: The cardiomediastinal  silhouette is within normal limits. No airspace consolidation, edema, pleural effusion, pneumothorax is identified. No intraperitoneal free air is identified. Scattered gas and stool are noted in the colon. No dilated loops of bowel are seen to suggest obstruction. No acute osseous abnormality is identified. IMPRESSION: Negative abdominal radiographs.  No acute cardiopulmonary disease. Electronically Signed   By: Sebastian Ache M.D.   On: 07/03/2018 08:42    Procedures Procedures (including critical care time)  Medications Ordered in ED Medications - No data to display   Initial Impression / Assessment and Plan / ED Course  I have reviewed the triage vital signs and the nursing notes.  Pertinent labs & imaging results that were available during my care of the patient were reviewed by me and considered  in my medical decision making (see chart for details).    Patient presents with some generalized symptoms.  He has had body aches, generalized nausea and upper abdominal discomfort.  Some increased frequency of stool without frank diarrhea.  No documented fever but subjective fever.  Diagnostic work-up is within normal limits.  Patient's physical exam is well in appearance without significant findings.  Although he has some upper abdominal discomfort, abdomen is soft without guarding.  I do not suspect surgical etiology at this time.  Will make recommendations for symptomatic treatment and return precautions have been reviewed.  Patient has PCP and will follow up at the beginning of the week.  Final Clinical Impressions(s) / ED Diagnoses   Final diagnoses:  Viral syndrome  Generalized abdominal pain    ED Discharge Orders         Ordered    famotidine (PEPCID) 20 MG tablet  2 times daily     07/03/18 1103    ondansetron (ZOFRAN ODT) 4 MG disintegrating tablet  Every 4 hours PRN     07/03/18 1103    acetaminophen (TYLENOL) 500 MG tablet  Every 6 hours PRN     07/03/18 1103             Arby BarrettePfeiffer, Delmus Warwick, MD 07/03/18 1114

## 2018-07-04 ENCOUNTER — Other Ambulatory Visit: Payer: Self-pay

## 2018-07-04 ENCOUNTER — Emergency Department (HOSPITAL_BASED_OUTPATIENT_CLINIC_OR_DEPARTMENT_OTHER): Payer: PPO

## 2018-07-04 ENCOUNTER — Encounter (HOSPITAL_BASED_OUTPATIENT_CLINIC_OR_DEPARTMENT_OTHER): Payer: Self-pay | Admitting: Emergency Medicine

## 2018-07-04 ENCOUNTER — Emergency Department (HOSPITAL_BASED_OUTPATIENT_CLINIC_OR_DEPARTMENT_OTHER)
Admission: EM | Admit: 2018-07-04 | Discharge: 2018-07-05 | Disposition: A | Discharge status: Home / Self Care | Payer: PPO | Attending: Emergency Medicine | Admitting: Emergency Medicine

## 2018-07-04 DIAGNOSIS — R1084 Generalized abdominal pain: Secondary | ICD-10-CM | POA: Diagnosis not present

## 2018-07-04 DIAGNOSIS — R509 Fever, unspecified: Secondary | ICD-10-CM

## 2018-07-04 DIAGNOSIS — R1010 Upper abdominal pain, unspecified: Secondary | ICD-10-CM

## 2018-07-04 DIAGNOSIS — E039 Hypothyroidism, unspecified: Secondary | ICD-10-CM | POA: Diagnosis not present

## 2018-07-04 LAB — COMPREHENSIVE METABOLIC PANEL
ALT: 27 U/L (ref 0–44)
AST: 20 U/L (ref 15–41)
Albumin: 4.5 g/dL (ref 3.5–5.0)
Alkaline Phosphatase: 46 U/L (ref 38–126)
Anion gap: 9 (ref 5–15)
BILIRUBIN TOTAL: 0.8 mg/dL (ref 0.3–1.2)
BUN: 16 mg/dL (ref 6–20)
CHLORIDE: 102 mmol/L (ref 98–111)
CO2: 26 mmol/L (ref 22–32)
CREATININE: 1.03 mg/dL (ref 0.61–1.24)
Calcium: 9.4 mg/dL (ref 8.9–10.3)
Glucose, Bld: 107 mg/dL — ABNORMAL HIGH (ref 70–99)
POTASSIUM: 3.8 mmol/L (ref 3.5–5.1)
Sodium: 137 mmol/L (ref 135–145)
Total Protein: 8 g/dL (ref 6.5–8.1)

## 2018-07-04 LAB — CBC WITH DIFFERENTIAL/PLATELET
Abs Immature Granulocytes: 0.03 10*3/uL (ref 0.00–0.07)
BASOS PCT: 1 %
Basophils Absolute: 0.1 10*3/uL (ref 0.0–0.1)
EOS PCT: 0 %
Eosinophils Absolute: 0 10*3/uL (ref 0.0–0.5)
HEMATOCRIT: 41.9 % (ref 39.0–52.0)
Hemoglobin: 13.9 g/dL (ref 13.0–17.0)
Immature Granulocytes: 0 %
Lymphocytes Relative: 17 %
Lymphs Abs: 1.4 10*3/uL (ref 0.7–4.0)
MCH: 29.3 pg (ref 26.0–34.0)
MCHC: 33.2 g/dL (ref 30.0–36.0)
MCV: 88.4 fL (ref 80.0–100.0)
MONO ABS: 0.4 10*3/uL (ref 0.1–1.0)
MONOS PCT: 5 %
NRBC: 0 % (ref 0.0–0.2)
Neutro Abs: 6.6 10*3/uL (ref 1.7–7.7)
Neutrophils Relative %: 77 %
PLATELETS: 178 10*3/uL (ref 150–400)
RBC: 4.74 MIL/uL (ref 4.22–5.81)
RDW: 12.2 % (ref 11.5–15.5)
WBC: 8.5 10*3/uL (ref 4.0–10.5)

## 2018-07-04 LAB — LIPASE, BLOOD: LIPASE: 76 U/L — AB (ref 11–51)

## 2018-07-04 MED ORDER — IOPAMIDOL (ISOVUE-300) INJECTION 61%
100.0000 mL | Freq: Once | INTRAVENOUS | Status: AC
  Administered 2018-07-04: 100 mL via INTRAVENOUS

## 2018-07-04 MED ORDER — ACETAMINOPHEN 325 MG PO TABS
650.0000 mg | ORAL_TABLET | Freq: Once | ORAL | Status: AC
  Administered 2018-07-04: 650 mg via ORAL
  Filled 2018-07-04: qty 2

## 2018-07-04 MED ORDER — SODIUM CHLORIDE 0.9 % IV BOLUS
1000.0000 mL | Freq: Once | INTRAVENOUS | Status: AC
  Administered 2018-07-04: 1000 mL via INTRAVENOUS

## 2018-07-04 NOTE — ED Triage Notes (Signed)
Pt states he has upper abdominal pain and has nausea, and vomiting. He says he "feels like I have a fever and am having chills and a severe (9/10) headache." He states he was seen here yesterday and the medication he was given is not working.

## 2018-07-04 NOTE — ED Provider Notes (Signed)
[facility] EMERGENCY DEPARTMENT Provider Note   CSN: [id number] Arrival date & time: 07/04/18  2110     History   Chief Complaint Chief Complaint  Patient presents with  . Abdominal Pain  . Headache    HPI Timothy Austin is a 32 y.o. male.  32 y.o male with a PMH of Hypercholesteremia presents to the ED with a chief complaint of recurrent emesis and abdominal pain.  Patient was seen in the ED yesterday and discharge home with a prescription for zofran, tylenol and pepcid to help with his symptoms. He reports taking the zofran twice this morning and having one episode of emesis after breakfast. He also reports having 3 episodes of emesis in the evening and not being able to keep any liquids or solids down. Patient also report he feels " a chill inside me" which has been going on since Friday. He reports having a bowel movement sometime yesterday but does not recall if it was solid or liquid. Patient reports he has pain from his head to his tonsils, to his back and legs. He denies any chest pain, shortness of breath, neck rigidity, or urinary symptoms.       Past Medical History:  Diagnosis Date  . Dizziness   . Hypercholesteremia   . Osteochondritis dissecans of left knee   . Thyroid disease   . Wears glasses     Patient Active Problem List   Diagnosis Date Noted  . Thoracic back pain 06/05/2018  . Costochondral chest pain 05/20/2018  . Dyslipidemia 05/20/2018  . Hypothyroidism 11/18/2017  . Wellness examination 09/21/2015  . S/P left knee arthroscopy 03/06/2015    Past Surgical History:  Procedure Laterality Date  . APPENDECTOMY  age 66  . CHONDROPLASTY Left 03/06/2015   Procedure: CHONDROPLASTY;  Surgeon: Eugenia Mcalpine, MD;  [facility];  Service: Orthopedics;  Laterality: Left;  . KNEE ARTHROSCOPY Left 03/06/2015   Procedure: ARTHROSCOPY KNEE DEBRIDEMENT  of OCD LESION  AND HARVEST CARTILAGE;  Surgeon: Eugenia Mcalpine, MD;   Location: [facility] [place];  Service: Orthopedics;  Laterality: Left;        Home Medications    Prior to Admission medications   Medication Sig Start Date End Date Taking? Authorizing Provider  acetaminophen (TYLENOL) 500 MG tablet Take 2 tablets (1,000 mg total) by mouth every 6 (six) hours as needed. 07/03/18   Arby Barrette, MD  cyclobenzaprine (FLEXERIL) 10 MG tablet Take 1 tablet (10 mg total) by mouth 3 (three) times daily as needed for muscle spasms. 06/05/18   Glori Luis, MD  famotidine (PEPCID) 20 MG tablet Take 1 tablet (20 mg total) by mouth 2 (two) times daily. 07/03/18   Arby Barrette, MD  ibuprofen (ADVIL,MOTRIN) 600 MG tablet Take 1 tablet (600 mg total) by mouth every 8 (eight) hours as needed for mild pain. 06/05/18   Glori Luis, MD  ondansetron (ZOFRAN ODT) 4 MG disintegrating tablet Take 1 tablet (4 mg total) by mouth every 4 (four) hours as needed for nausea or vomiting. 07/03/18   Arby Barrette, MD  ondansetron (ZOFRAN) 4 MG tablet Take 4 mg by mouth every 8 (eight) hours as needed for nausea or vomiting.    [provider]  SYNTHROID 100 MCG tablet TAKE 1 TABLET BY MOUTH EVERY DAY BEFORE BREAKFAST 06/28/18   Sharlene Dory, DO    Family History Family History  Problem Relation Age of Onset  . Hypertension Mother   . Diabetes  Mother   . Hypertension Father   . Diabetes Father     Social History Social History   Tobacco Use  . Smoking status: Never Smoker  . Smokeless tobacco: Never Used  Substance Use Topics  . Alcohol use: No  . Drug use: No     Allergies   Patient has no known allergies.   Review of Systems Review of Systems  Constitutional: Positive for chills and fever.  Respiratory: Negative for cough and shortness of breath.   Cardiovascular: Negative for chest pain.  Gastrointestinal: Positive for abdominal pain, nausea and vomiting. Negative for anal bleeding, constipation and diarrhea.    Genitourinary: Negative for dysuria and flank pain.  Musculoskeletal: Negative for back pain.  Neurological: Positive for headaches.     Physical Exam Updated Vital Signs BP 123/71   Pulse (!) 105   Temp 99.2 F (37.3 C) (Oral)   Resp 19   Ht 5\' 9"  (1.753 m)   Wt 97.5 kg   SpO2 95%   BMI 31.75 kg/m   Physical Exam  Constitutional: He is oriented to person, place, and time. He appears well-developed and well-nourished.  HENT:  Head: Normocephalic and atraumatic.  Mouth/Throat: Oropharynx is clear and moist.  Eyes: Pupils are equal, round, and reactive to light. No scleral icterus.  Neck: Normal range of motion.  Cardiovascular: Normal heart sounds.  Pulmonary/Chest: Effort normal and breath sounds normal. He has no wheezes. He exhibits no tenderness.  Abdominal: Soft. Bowel sounds are normal. He exhibits no distension. There is generalized tenderness. There is no rigidity, no rebound, no guarding and no CVA tenderness.  Musculoskeletal: He exhibits no tenderness or deformity.  Neurological: He is alert and oriented to person, place, and time.  Skin: Skin is warm and dry.  Nursing note and vitals reviewed.    ED Treatments / Results  Labs (all labs ordered are listed, but only abnormal results are displayed) Labs Reviewed  COMPREHENSIVE METABOLIC PANEL - Abnormal; Notable for the following components:      Result Value   Glucose, Bld 107 (*)    All other components within normal limits  LIPASE, BLOOD - Abnormal; Notable for the following components:   Lipase 76 (*)    All other components within normal limits  CBC WITH DIFFERENTIAL/PLATELET  URINALYSIS, ROUTINE W REFLEX MICROSCOPIC    EKG None  Radiology Dg Abd Acute W/chest  Result Date: 07/03/2018 CLINICAL DATA:  Body aches and nausea for 2 days.  Subjective fever. EXAM: DG ABDOMEN ACUTE W/ 1V CHEST COMPARISON:  Chest radiograph 02/19/2018. CT abdomen and pelvis 03/01/2018. FINDINGS: The cardiomediastinal  silhouette is within normal limits. No airspace consolidation, edema, pleural effusion, pneumothorax is identified. No intraperitoneal free air is identified. Scattered gas and stool are noted in the colon. No dilated loops of bowel are seen to suggest obstruction. No acute osseous abnormality is identified. IMPRESSION: Negative abdominal radiographs.  No acute cardiopulmonary disease. Electronically Signed   By: Sebastian AcheAllen  Grady M.D.   On: 07/03/2018 08:42    Procedures Procedures (including critical care time)  Medications Ordered in ED Medications  sodium chloride 0.9 % bolus 1,000 mL (0 mLs Intravenous Stopped 07/04/18 2349)  acetaminophen (TYLENOL) tablet 650 mg (650 mg Oral Given 07/04/18 2239)  iopamidol (ISOVUE-300) 61 % injection 100 mL (100 mLs Intravenous Contrast Given 07/04/18 2356)     Initial Impression / Assessment and Plan / ED Course  I have reviewed the triage vital signs and the nursing notes.  Pertinent  labs & imaging results that were available during my care of the patient were reviewed by me and considered in my medical decision making (see chart for details).    Patient presents with abdominal pain. He was seen in the ED yesterday and sent home with zofran, pepcid for symptomatic treatment. Today he reports he is unable to tolerate any fluids or solids and has had multiple episodes of vomiting despite the zofran. During evaluation patient feels febrile, I have recheck his temperature myself and patient is febrile at this time. He had a DG abdomen series which showed no acute process however due to recurrent symptoms and fever will order a CT abdomen and pelvis to further evaluate and r/o any possible diverticulitis, appendicitis. Low suspicion for SBO as patient had bowel movement yesterday.   CBC showed no leukocytosis, hemoglobin unchanged laboratory results are consistent with yesterday's visit. CMP showed no electrolyte abnormality. Lipase is elevated today 76 compared  to 35 from yesterday's visit. Patient provided with fluids along with tylenol for his fever.   12:21 AM Care transferred to Dr. Wilkie Aye at shift change.   Final Clinical Impressions(s) / ED Diagnoses   Final diagnoses:  Fever, unspecified fever cause  Pain of upper abdomen    ED Discharge Orders    None       Claude Manges, PA-C 07/05/18 0021    Shon Baton, MD 07/05/18 [phone]

## 2018-07-04 NOTE — ED Notes (Signed)
Pt is aware we need urine specimen 

## 2018-07-04 NOTE — ED Notes (Signed)
Patient transported to CT 

## 2018-07-05 ENCOUNTER — Telehealth: Payer: Self-pay | Admitting: Family Medicine

## 2018-07-05 LAB — URINALYSIS, ROUTINE W REFLEX MICROSCOPIC
BILIRUBIN URINE: NEGATIVE
GLUCOSE, UA: NEGATIVE mg/dL
KETONES UR: 15 mg/dL — AB
Leukocytes, UA: NEGATIVE
NITRITE: NEGATIVE
Protein, ur: NEGATIVE mg/dL
Specific Gravity, Urine: 1.02 (ref 1.005–1.030)
pH: 6.5 (ref 5.0–8.0)

## 2018-07-05 LAB — URINALYSIS, MICROSCOPIC (REFLEX)

## 2018-07-05 NOTE — Telephone Encounter (Signed)
He needs appt, I have not evaluated him for this, nor anyone else in this office. Ginger is alternative in meanwhile. TY.

## 2018-07-05 NOTE — Telephone Encounter (Signed)
Copied from CRM [phone]#[id number]. Topic: Quick Communication - See Telephone Encounter >> Jul 05, 2018  4:19 PM Herby AbrahamJohnson, Shiquita C wrote: CRM for notification. See Telephone encounter for: 07/05/18.  Pt says that he was seen in the ED and was prescribed a medication for nausea. Pt says that the medication is causing him to have headaches. Pt would like to know if provider could prescribe something else that wouldn't cause a headache?   Pt is requesting a call back   Pharmacy: [id number]- [place], [place] - [address] [phone] (Phone) [phone] (Fax)    CB: [phone]

## 2018-07-05 NOTE — Telephone Encounter (Signed)
Patient called back and scheduled hospital follow-up with PCP.

## 2018-07-05 NOTE — ED Provider Notes (Signed)
Signed out pending CT scan.  CT scan does not show any acute abdominal process.  Only other abnormalities are 15 ketones in the urine and slight elevation in lipase to the mid 70s.  This is nondiagnostic.  Likely related to an acute viral syndrome.  Patient is able to tolerate fluids.  Recommend continued supportive care at home.  Temperature of 100.6 while here.  He was instructed to take Tylenol and make sure to stay hydrated.  I discussed his CT results with him.  After history, exam, and medical workup I feel the patient has been appropriately medically screened and is safe for discharge home. Pertinent diagnoses were discussed with the patient. Patient was given return precautions.    Shon BatonHorton,  F, MD 07/05/18 [phone]

## 2018-07-05 NOTE — ED Notes (Signed)
ED Provider at bedside. 

## 2018-07-05 NOTE — Telephone Encounter (Signed)
Called left message to call back 

## 2018-07-05 NOTE — Discharge Instructions (Signed)
You may continue to take zofran for your nausea as needed. You may also take tylenol for your fever. Please follow up with your primary care physician in 1 week for reevaluation of symptoms. If you experience any worsening symptoms, blood in your vomit or chest pain you may return to the ED for reevaluation.

## 2018-07-07 ENCOUNTER — Ambulatory Visit (INDEPENDENT_AMBULATORY_CARE_PROVIDER_SITE_OTHER): Payer: PPO | Admitting: Family Medicine

## 2018-07-07 ENCOUNTER — Encounter: Payer: Self-pay | Admitting: Family Medicine

## 2018-07-07 VITALS — BP 110/78 | HR 85 | Temp 98.4°F | Ht 70.0 in | Wt 209.4 lb

## 2018-07-07 DIAGNOSIS — G44209 Tension-type headache, unspecified, not intractable: Secondary | ICD-10-CM

## 2018-07-07 DIAGNOSIS — K2900 Acute gastritis without bleeding: Secondary | ICD-10-CM

## 2018-07-07 DIAGNOSIS — M791 Myalgia, unspecified site: Secondary | ICD-10-CM

## 2018-07-07 MED ORDER — PROMETHAZINE HCL 25 MG PO TABS: 25.0000 mg | ORAL_TABLET | Freq: Four times a day (QID) | ORAL | 0 refills | Status: DC | PRN

## 2018-07-07 MED ORDER — KETOROLAC TROMETHAMINE 60 MG/2ML IM SOLN
60.0000 mg | Freq: Once | INTRAMUSCULAR | Status: AC
  Administered 2018-07-07: 60 mg via INTRAMUSCULAR

## 2018-07-07 MED ORDER — DICYCLOMINE HCL 10 MG PO CAPS: 10.0000 mg | ORAL_CAPSULE | Freq: Three times a day (TID) | ORAL | 0 refills | Status: DC

## 2018-07-07 MED ORDER — MELOXICAM 15 MG PO TABS: 15.0000 mg | ORAL_TABLET | Freq: Every day | ORAL | 0 refills | Status: DC

## 2018-07-07 NOTE — Patient Instructions (Addendum)
Stay hydrated.  OK to take Tylenol 1000 mg (2 extra strength tabs) or 975 mg (3 regular strength tabs) every 6 hours as needed.  No Mobic/ibuprofen for rest of day.  Let us know if you need anything.

## 2018-07-07 NOTE — Progress Notes (Signed)
Pre visit review using our clinic review tool, if applicable. No additional management support is needed unless otherwise documented below in the visit note. 

## 2018-07-07 NOTE — Progress Notes (Signed)
Chief Complaint  Patient presents with  . Hospitalization Follow-up    Subjective: Patient is a 32 y.o. male here for ED f/u.  Seen for fever and abd pain. Imaging unremarkable. Currently having HA. Continues to have generalized aches and abdominal pain. Some nausea, Zofran gives him headaches. Headache is 10/10 right now, has not been able to eat/drink normally as of late. Denies continued fevers, has not vomited for 3 d.  No sick contacts or medication changes otherwise.   ROS: Const: no fevers GI:+N/V, no bowel changes  Past Medical History:  Diagnosis Date  . Dizziness   . Hypercholesteremia   . Osteochondritis dissecans of left knee   . Thyroid disease   . Wears glasses     Objective: BP 110/78 (BP Location: Left Arm, Patient Position: Sitting, Cuff Size: Normal)   Pulse 85   Temp 98.4 F (36.9 C) (Oral)   Ht 5\' 10"  (1.778 m)   Wt 209 lb 6 oz (95 kg)   SpO2 96%   BMI 30.04 kg/m  General: Awake, appears stated age HEENT: MMM, EOMi Heart: RRR, no murmurs Lungs: CTAB, no rales, wheezes or rhonchi. No accessory muscle use Abdomen: Bowel sounds present, diffuse tenderness to palpation, nondistended Psych: Age appropriate judgment and insight, normal affect and mood  Assessment and Plan: Acute gastritis without hemorrhage, unspecified gastritis type - Plan: promethazine (PHENERGAN) 25 MG tablet, dicyclomine (BENTYL) 10 MG capsule  Myalgia - Plan: meloxicam (MOBIC) 15 MG tablet  Acute non intractable tension-type headache - Plan: meloxicam (MOBIC) 15 MG tablet  Toradol today for myalgia/headache.  Mobic starting tomorrow.  Tylenol.  He needs to stay well-hydrated.  He continues to improve with a gastritis, however due to poor oral intake is having issues with #2 and #3. F/u prn. The patient voiced understanding and agreement to the plan.  Jilda Rocheicholas Paul [place], DO 07/07/18  11:59 AM

## 2018-07-07 NOTE — Addendum Note (Signed)
Addended by: Scharlene GlossEWING, Kewanda Poland B on: 07/07/2018 12:02 PM   Modules accepted: Orders

## 2018-07-09 ENCOUNTER — Encounter: Payer: Self-pay | Admitting: Medical

## 2018-07-09 ENCOUNTER — Ambulatory Visit (INDEPENDENT_AMBULATORY_CARE_PROVIDER_SITE_OTHER): Payer: PPO | Admitting: Medical

## 2018-07-09 VITALS — BP 114/64 | HR 94 | Temp 98.2°F | Resp 16 | Ht 70.0 in | Wt 210.2 lb

## 2018-07-09 DIAGNOSIS — R51 Headache: Secondary | ICD-10-CM

## 2018-07-09 DIAGNOSIS — R1013 Epigastric pain: Secondary | ICD-10-CM

## 2018-07-09 DIAGNOSIS — R748 Abnormal levels of other serum enzymes: Secondary | ICD-10-CM

## 2018-07-09 DIAGNOSIS — R519 Headache, unspecified: Secondary | ICD-10-CM

## 2018-07-09 DIAGNOSIS — R739 Hyperglycemia, unspecified: Secondary | ICD-10-CM

## 2018-07-09 MED ORDER — NAPROXEN 500 MG PO TABS: ORAL_TABLET | ORAL | 0 refills | Status: DC

## 2018-07-09 MED ORDER — KETOROLAC TROMETHAMINE 30 MG/ML IJ SOLN
30.0000 mg | Freq: Once | INTRAMUSCULAR | Status: AC
  Administered 2018-07-09: 30 mg via INTRAMUSCULAR

## 2018-07-09 NOTE — Progress Notes (Signed)
Subjective:    Patient ID: Timothy Austin, male    DOB: 1985-10-15, 32 y.o.   MRN: [id number]  HPI  Pt in for follow up.   Pt pcp Dr. Carmelia Roller.   Pt has seen ED twice since 07-03-2018.  Pt saw Dr. Carmelia Roller and was given was told to take tylenol. Told not to take mobic and ibuprofen the day of last visit when given toradol.  But then he resumed mobic Pt states his headache has been coming and going sometimes other times feels more constant.   Very faint ha now but worse in the morning. Often times headache worse after sunset.    Pt as seen in the emergency dept and at that time his ha was more severe and constant. This was in context of viral syndrome.  No vision changes or any gross motor or sensory function deficits.  Pt has been using tylenol and still having ha. Pt states meloxam causes him excess drownsiness and zofran increased ha very quickly.    Pt was confused initially and stated promethazine made him too drowsy. Then he definitively states he got confused and meloxicam is what made him too sleepy. I clarified this and he reasured me that he wanted alternative for meloxicam.(Pt finally expressed no doubt regarding pt that meloxicam made him too drowsy.)   Pt still has faint abdomen discomfort. But less than before. Pt had negative h pylori in the past. Pt lipase was mild elevated 5 days ago. Pt pancrease protein was mild elevated. On ED note pt was sick for 2 days before being seen in ED.   Pt has seen GI MD in past for abdomen pain. Below is GI A & P  32 y.o. male with left upper quadrant discomfort  I think he may be feeling some minor intermittent trapped gas.  I tried to reassure him that there is nothing serious going on given his overall clinical situation with normal labs, normal ultrasound, normal imaging CT scan.  I did tell him that the next step in evaluation would generally be an upper endoscopy but I do not feel that he necessarily had to have it.  He was  relieved to hear all this and is not too concerned just now that he would want to go ahead with an upper endoscopy.  He will call if he becomes very bothered by this symptom again and at that time I would likely proceed with upper endoscopy.     Review of Systems  Constitutional: Negative for chills, fatigue and fever.  Respiratory: Negative for cough, chest tightness, shortness of breath and wheezing.   Cardiovascular: Negative for chest pain and palpitations.  Gastrointestinal: Positive for abdominal pain. Negative for abdominal distention, blood in stool, constipation, diarrhea, nausea, rectal pain and vomiting.       See hpi.  Since recent illnes he feels mouth mid dry.  Musculoskeletal: Negative for back pain, myalgias, neck pain and neck stiffness.  Skin: Negative for rash.  Neurological: Positive for headaches. Negative for dizziness, seizures, syncope, weakness and light-headedness.       During exam pt ha 2/10. He request toradol again.  Hematological: Negative for adenopathy. Does not bruise/bleed easily.  Psychiatric/Behavioral: Negative for behavioral problems and confusion. The patient is not nervous/anxious.    Past Medical History:  Diagnosis Date  . Dizziness   . Hypercholesteremia   . Osteochondritis dissecans of left knee   . Thyroid disease   . Wears glasses  Social History   Socioeconomic History  . Marital status: Married    Spouse name: Not on file  . Number of children: Not on file  . Years of education: Not on file  . Highest education level: Not on file  Occupational History  . Not on file  Social Needs  . Financial resource strain: Not on file  . Food insecurity:    Worry: Not on file    Inability: Not on file  . Transportation needs:    Medical: Not on file    Non-medical: Not on file  Tobacco Use  . Smoking status: Never Smoker  . Smokeless tobacco: Never Used  Substance and Sexual Activity  . Alcohol use: No  . Drug use: No  .  Sexual activity: Not on file  Lifestyle  . Physical activity:    Days per week: Not on file    Minutes per session: Not on file  . Stress: Not on file  Relationships  . Social connections:    Talks on phone: Not on file    Gets together: Not on file    Attends religious service: Not on file    Active member of club or organization: Not on file    Attends meetings of clubs or organizations: Not on file    Relationship status: Not on file  . Intimate partner violence:    Fear of current or ex partner: Not on file    Emotionally abused: Not on file    Physically abused: Not on file    Forced sexual activity: Not on file  Other Topics Concern  . Not on file  Social History Narrative  . Not on file    Past Surgical History:  Procedure Laterality Date  . APPENDECTOMY  age 32  . CHONDROPLASTY Left 03/06/2015   Procedure: CHONDROPLASTY;  Surgeon: Eugenia Mcalpineobert Collins, MD;  [facility] [place];  Service: Orthopedics;  Laterality: Left;  . KNEE ARTHROSCOPY Left 03/06/2015   Procedure: ARTHROSCOPY KNEE DEBRIDEMENT  of OCD LESION  AND HARVEST CARTILAGE;  Surgeon: Eugenia Mcalpineobert Collins, MD;  [facility] [place];  Service: Orthopedics;  Laterality: Left;    Family History  Problem Relation Age of Onset  . Hypertension Mother   . Diabetes Mother   . Hypertension Father   . Diabetes Father     No Known Allergies  Current Outpatient Medications on File Prior to Visit  Medication Sig Dispense Refill  . acetaminophen (TYLENOL) 500 MG tablet Take 2 tablets (1,000 mg total) by mouth every 6 (six) hours as needed. 30 tablet 0  . dicyclomine (BENTYL) 10 MG capsule Take 1 capsule (10 mg total) by mouth 4 (four) times daily -  before meals and at bedtime. 30 capsule 0  . meloxicam (MOBIC) 15 MG tablet Take 1 tablet (15 mg total) by mouth daily. 30 tablet 0  . promethazine (PHENERGAN) 25 MG tablet Take 1 tablet (25 mg total) by mouth every 6 (six) hours as needed for nausea  or vomiting. 30 tablet 0  . SYNTHROID 100 MCG tablet TAKE 1 TABLET BY MOUTH EVERY DAY BEFORE BREAKFAST 30 tablet 4   No current facility-administered medications on file prior to visit.     BP 114/64   Pulse 94   Temp 98.2 F (36.8 C) (Oral)   Resp 16   Ht 5\' 10"  (1.778 m)   Wt 210 lb 3.2 oz (95.3 kg)   SpO2 100%   BMI 30.16 kg/m  Objective:   Physical Exam  General Mental Status- Alert. General Appearance- Not in acute distress.   Skin General: Color- Normal Color. Moisture- Normal Moisture.  Neck Carotid Arteries- Normal color. Moisture- Normal Moisture. No carotid bruits. No JVD.  Chest and Lung Exam Auscultation: Breath Sounds:-Normal.  Cardiovascular Auscultation:Rythm- Regular. Murmurs & Other Heart Sounds:Auscultation of the heart reveals- No Murmurs.  Abdomen Inspection:-Inspeection Normal. Palpation/Percussion:Note:No mass. Palpation and Percussion of the abdomen reveal- Non Tender, Non Distended + BS, no rebound or guarding.    Neurologic Cranial Nerve exam:- CN III-XII intact(No nystagmus), symmetric smile. Drift Test:- No drift. Finger to Nose:- Normal/Intact Strength:- 5/5 equal and symmetric strength both upper and lower extremities.      Assessment & Plan:  For your recent headache, we gave Toradol 30 mg IM injection today.  I did send in prescription of Naprosyn to your pharmacy.  You can start that tomorrow afternoon if needed.  Make sure you are well-hydrated with either propel fitness water or sugar-free Gatorade.  Want to eliminate possibility of you getting a dehydration type headache.  Would recommend trying to follow-up with your PCP within a week or 2 to discuss discuss how you are doing.  You might need referral to neurologist.  For recent elevated blood sugars, will get A1c today.  With slight elevated lipase level recently, I want to repeat metabolic panel and get amylase as well as lipase.  You could try some Tagamet  over-the-counter to see if this helps with your intermittent mild abdomen pain.  Continue with promethazine as needed.  Consider going ahead and doing EGD with gastroenterologist.  Follow-up in 1 to 2 weeks or as needed.  If recurrent severe type headache as you described recently or any gross motor or neurologic deficits then recommend ED evaluation.  40 minute spent with pt. 50% of time spent counseling pt on ha treatment, and abdomen pain in light of persisting symptoms despite to ED visits recently and one visit with pcp.    Esperanza Richters, PA-C

## 2018-07-09 NOTE — Patient Instructions (Signed)
For your recent headache, we gave Toradol 30 mg IM injection today.  I did send in prescription of Naprosyn to your pharmacy.  You can start that tomorrow afternoon if needed.  Make sure you are well-hydrated with either propel fitness water or sugar-free Gatorade.  Want to eliminate possibility of you getting a dehydration type headache.  Would recommend trying to follow-up with your PCP within a week or 2 to discuss discuss how you are doing.  You might need referral to neurologist.  For recent elevated blood sugars, will get A1c today.  With slight elevated lipase level recently, I want to repeat metabolic panel and get amylase as well as lipase.  You could try some Tagamet over-the-counter to see if this helps with your intermittent mild abdomen pain.  Continue with promethazine as needed.  Consider going ahead and doing EGD with gastroenterologist.  Follow-up in 1 to 2 weeks or as needed.  If recurrent severe type headache as you described recently or any gross motor or neurologic deficits then recommend ED evaluation.

## 2018-07-10 LAB — COMPREHENSIVE METABOLIC PANEL
AG Ratio: 1.5 (calc) (ref 1.0–2.5)
ALBUMIN MSPROF: 4.4 g/dL (ref 3.6–5.1)
ALT: 18 U/L (ref 9–46)
AST: 15 U/L (ref 10–40)
Alkaline phosphatase (APISO): 46 U/L (ref 40–115)
BUN: 20 mg/dL (ref 7–25)
CO2: 26 mmol/L (ref 20–32)
Calcium: 9.2 mg/dL (ref 8.6–10.3)
Chloride: 104 mmol/L (ref 98–110)
Creat: 1.18 mg/dL (ref 0.60–1.35)
GLUCOSE: 96 mg/dL (ref 65–99)
Globulin: 2.9 g/dL (calc) (ref 1.9–3.7)
POTASSIUM: 3.8 mmol/L (ref 3.5–5.3)
SODIUM: 140 mmol/L (ref 135–146)
TOTAL PROTEIN: 7.3 g/dL (ref 6.1–8.1)
Total Bilirubin: 0.5 mg/dL (ref 0.2–1.2)

## 2018-07-10 LAB — AMYLASE: Amylase: 42 U/L (ref 21–101)

## 2018-07-10 LAB — LIPASE: LIPASE: 26 U/L (ref 7–60)

## 2018-07-10 LAB — HEMOGLOBIN A1C
EAG (MMOL/L): 6.2 (calc)
Hgb A1c MFr Bld: 5.5 % of total Hgb (ref <5.7)
Mean Plasma Glucose: 111 (calc)

## 2018-08-23 ENCOUNTER — Other Ambulatory Visit: Payer: PPO

## 2018-09-24 ENCOUNTER — Other Ambulatory Visit: Payer: Self-pay | Admitting: Family Medicine

## 2018-10-13 ENCOUNTER — Ambulatory Visit (INDEPENDENT_AMBULATORY_CARE_PROVIDER_SITE_OTHER): Payer: PPO | Admitting: Family Medicine

## 2018-10-13 ENCOUNTER — Encounter: Payer: Self-pay | Admitting: Family Medicine

## 2018-10-13 VITALS — BP 110/80 | HR 75 | Temp 99.0°F | Ht 71.5 in | Wt 212.5 lb

## 2018-10-13 DIAGNOSIS — E785 Hyperlipidemia, unspecified: Secondary | ICD-10-CM

## 2018-10-13 DIAGNOSIS — H6982 Other specified disorders of Eustachian tube, left ear: Secondary | ICD-10-CM | POA: Diagnosis not present

## 2018-10-13 DIAGNOSIS — M545 Low back pain, unspecified: Secondary | ICD-10-CM

## 2018-10-13 DIAGNOSIS — R1013 Epigastric pain: Secondary | ICD-10-CM | POA: Diagnosis not present

## 2018-10-13 LAB — LIPID PANEL
CHOL/HDL RATIO: 6
Cholesterol: 215 mg/dL — ABNORMAL HIGH (ref 0–200)
HDL: 34.6 mg/dL — ABNORMAL LOW (ref >39.00)
LDL Cholesterol: 165 mg/dL — ABNORMAL HIGH (ref 0–99)
NONHDL: 180.56
Triglycerides: 77 mg/dL (ref 0.0–149.0)
VLDL: 15.4 mg/dL (ref 0.0–40.0)

## 2018-10-13 MED ORDER — PANTOPRAZOLE SODIUM 40 MG PO TBEC: 40.0000 mg | DELAYED_RELEASE_TABLET | Freq: Every day | ORAL | 3 refills | Status: DC

## 2018-10-13 MED ORDER — FLUTICASONE PROPIONATE 50 MCG/ACT NA SUSP: 2.0000 | Freq: Every day | NASAL | 2 refills | Status: DC

## 2018-10-13 NOTE — Patient Instructions (Signed)
Heat (pad or rice pillow in microwave) over affected area, 10-15 minutes twice daily.   Give [place] 2-3 business days to get the results of your labs back.   EXERCISES  RANGE OF MOTION (ROM) AND STRETCHING EXERCISES - Low Back Pain Most people with lower back pain will find that their symptoms get worse with excessive bending forward (flexion) or arching at the lower back (extension). The exercises that will help resolve your symptoms will focus on the opposite motion.  If you have pain, numbness or tingling which travels down into your buttocks, leg or foot, the goal of the therapy is for these symptoms to move closer to your back and eventually resolve. Sometimes, these leg symptoms will get better, but your lower back pain may worsen. This is often an indication of progress in your rehabilitation. Be very alert to any changes in your symptoms and the activities in which you participated in the 24 hours prior to the change. Sharing this information with your caregiver will allow him or her to most efficiently treat your condition. These exercises may help you when beginning to rehabilitate your injury. Your symptoms may resolve with or without further involvement from your physician, physical therapist or athletic trainer. While completing these exercises, remember:   Restoring tissue flexibility helps normal motion to return to the joints. This allows healthier, less painful movement and activity.  An effective stretch should be held for at least 30 seconds.  A stretch should never be painful. You should only feel a gentle lengthening or release in the stretched tissue. FLEXION RANGE OF MOTION AND STRETCHING EXERCISES:  STRETCH - Flexion, Single Knee to Chest   Lie on a firm bed or floor with both legs extended in front of you.  Keeping one leg in contact with the floor, bring your opposite knee to your chest. Hold your leg in place by either grabbing behind your thigh or at your knee.  Pull  until you feel a gentle stretch in your low back. Hold 30 seconds.  Slowly release your grasp and repeat the exercise with the opposite side. Repeat 2 times. Complete this exercise 3 times per week.   STRETCH - Flexion, Double Knee to Chest  Lie on a firm bed or floor with both legs extended in front of you.  Keeping one leg in contact with the floor, bring your opposite knee to your chest.  Tense your stomach muscles to support your back and then lift your other knee to your chest. Hold your legs in place by either grabbing behind your thighs or at your knees.  Pull both knees toward your chest until you feel a gentle stretch in your low back. Hold 30 seconds.  Tense your stomach muscles and slowly return one leg at a time to the floor. Repeat 2 times. Complete this exercise 3 times per week.   STRETCH - Low Trunk Rotation  Lie on a firm bed or floor. Keeping your legs in front of you, bend your knees so they are both pointed toward the ceiling and your feet are flat on the floor.  Extend your arms out to the side. This will stabilize your upper body by keeping your shoulders in contact with the floor.  Gently and slowly drop both knees together to one side until you feel a gentle stretch in your low back. Hold for 30 seconds.  Tense your stomach muscles to support your lower back as you bring your knees back to the starting position.  Repeat the exercise to the other side. Repeat 2 times. Complete this exercise at least 3 times per week.   EXTENSION RANGE OF MOTION AND FLEXIBILITY EXERCISES:  STRETCH - Extension, Prone on Elbows   Lie on your stomach on the floor, a bed will be too soft. Place your palms about shoulder width apart and at the height of your head.  Place your elbows under your shoulders. If this is too painful, stack pillows under your chest.  Allow your body to relax so that your hips drop lower and make contact more completely with the floor.  Hold this  position for 30 seconds.  Slowly return to lying flat on the floor. Repeat 2 times. Complete this exercise 3 times per week.   RANGE OF MOTION - Extension, Prone Press Ups  Lie on your stomach on the floor, a bed will be too soft. Place your palms about shoulder width apart and at the height of your head.  Keeping your back as relaxed as possible, slowly straighten your elbows while keeping your hips on the floor. You may adjust the placement of your hands to maximize your comfort. As you gain motion, your hands will come more underneath your shoulders.  Hold this position 30 seconds.  Slowly return to lying flat on the floor. Repeat 2 times. Complete this exercise 3 times per week.   RANGE OF MOTION- Quadruped, Neutral Spine   Assume a hands and knees position on a firm surface. Keep your hands under your shoulders and your knees under your hips. You may place padding under your knees for comfort.  Drop your head and point your tailbone toward the ground below you. This will round out your lower back like an angry cat. Hold this position for 30 seconds.  Slowly lift your head and release your tail bone so that your back sags into a large arch, like an old horse.  Hold this position for 30 seconds.  Repeat this until you feel limber in your low back.  Now, find your "sweet spot." This will be the most comfortable position somewhere between the two previous positions. This is your neutral spine. Once you have found this position, tense your stomach muscles to support your low back.  Hold this position for 30 seconds. Repeat 2 times. Complete this exercise 3 times per week.   STRENGTHENING EXERCISES - Low Back Sprain These exercises may help you when beginning to rehabilitate your injury. These exercises should be done near your "sweet spot." This is the neutral, low-back arch, somewhere between fully rounded and fully arched, that is your least painful position. When performed in  this safe range of motion, these exercises can be used for people who have either a flexion or extension based injury. These exercises may resolve your symptoms with or without further involvement from your physician, physical therapist or athletic trainer. While completing these exercises, remember:   Muscles can gain both the endurance and the strength needed for everyday activities through controlled exercises.  Complete these exercises as instructed by your physician, physical therapist or athletic trainer. Increase the resistance and repetitions only as guided.  You may experience muscle soreness or fatigue, but the pain or discomfort you are trying to eliminate should never worsen during these exercises. If this pain does worsen, stop and make certain you are following the directions exactly. If the pain is still present after adjustments, discontinue the exercise until you can discuss the trouble with your caregiver.  STRENGTHENING -  Deep Abdominals, Pelvic Tilt   Lie on a firm bed or floor. Keeping your legs in front of you, bend your knees so they are both pointed toward the ceiling and your feet are flat on the floor.  Tense your lower abdominal muscles to press your low back into the floor. This motion will rotate your pelvis so that your tail bone is scooping upwards rather than pointing at your feet or into the floor. With a gentle tension and even breathing, hold this position for 3 seconds. Repeat 2 times. Complete this exercise 3 times per week.   STRENGTHENING - Abdominals, Crunches   Lie on a firm bed or floor. Keeping your legs in front of you, bend your knees so they are both pointed toward the ceiling and your feet are flat on the floor. Cross your arms over your chest.  Slightly tip your chin down without bending your neck.  Tense your abdominals and slowly lift your trunk high enough to just clear your shoulder blades. Lifting higher can put excessive stress on the lower  back and does not further strengthen your abdominal muscles.  Control your return to the starting position. Repeat 2 times. Complete this exercise 3 times per week.   STRENGTHENING - Quadruped, Opposite UE/LE Lift   Assume a hands and knees position on a firm surface. Keep your hands under your shoulders and your knees under your hips. You may place padding under your knees for comfort.  Find your neutral spine and gently tense your abdominal muscles so that you can maintain this position. Your shoulders and hips should form a rectangle that is parallel with the floor and is not twisted.  Keeping your trunk steady, lift your right hand no higher than your shoulder and then your left leg no higher than your hip. Make sure you are not holding your breath. Hold this position for 30 seconds.  Continuing to keep your abdominal muscles tense and your back steady, slowly return to your starting position. Repeat with the opposite arm and leg. Repeat 2 times. Complete this exercise 3 times per week.   STRENGTHENING - Abdominals and Quadriceps, Straight Leg Raise   Lie on a firm bed or floor with both legs extended in front of you.  Keeping one leg in contact with the floor, bend the other knee so that your foot can rest flat on the floor.  Find your neutral spine, and tense your abdominal muscles to maintain your spinal position throughout the exercise.  Slowly lift your straight leg off the floor about 6 inches for a count of 3, making sure to not hold your breath.  Still keeping your neutral spine, slowly lower your leg all the way to the floor. Repeat this exercise with each leg 2 times. Complete this exercise 3 times per week.  POSTURE AND BODY MECHANICS CONSIDERATIONS - Low Back Sprain Keeping correct posture when sitting, standing or completing your activities will reduce the stress put on different body tissues, allowing injured tissues a chance to heal and limiting painful experiences.  The following are general guidelines for improved posture.  While reading these guidelines, remember:  The exercises prescribed by your provider will help you have the flexibility and strength to maintain correct postures.  The correct posture provides the best environment for your joints to work. All of your joints have less wear and tear when properly supported by a spine with good posture. This means you will experience a healthier, less painful body.  Correct posture must be practiced with all of your activities, especially prolonged sitting and standing. Correct posture is as important when doing repetitive low-stress activities (typing) as it is when doing a single heavy-load activity (lifting).  RESTING POSITIONS Consider which positions are most painful for you when choosing a resting position. If you have pain with flexion-based activities (sitting, bending, stooping, squatting), choose a position that allows you to rest in a less flexed posture. You would want to avoid curling into a fetal position on your side. If your pain worsens with extension-based activities (prolonged standing, working overhead), avoid resting in an extended position such as sleeping on your stomach. Most people will find more comfort when they rest with their spine in a more neutral position, neither too rounded nor too arched. Lying on a non-sagging bed on your side with a pillow between your knees, or on your back with a pillow under your knees will often provide some relief. Keep in mind, being in any one position for a prolonged period of time, no matter how correct your posture, can still lead to stiffness.  PROPER SITTING POSTURE In order to minimize stress and discomfort on your spine, you must sit with correct posture. Sitting with good posture should be effortless for a healthy body. Returning to good posture is a gradual process. Many people can work toward this most comfortably by using various supports until  they have the flexibility and strength to maintain this posture on their own. When sitting with proper posture, your ears will fall over your shoulders and your shoulders will fall over your hips. You should use the back of the chair to support your upper back. Your lower back will be in a neutral position, just slightly arched. You may place a small pillow or folded towel at the base of your lower back for  support.  When working at a desk, create an environment that supports good, upright posture. Without extra support, muscles tire, which leads to excessive strain on joints and other tissues. Keep these recommendations in mind:  CHAIR:  A chair should be able to slide under your desk when your back makes contact with the back of the chair. This allows you to work closely.  The chair's height should allow your eyes to be level with the upper part of your monitor and your hands to be slightly lower than your elbows.  BODY POSITION  Your feet should make contact with the floor. If this is not possible, use a foot rest.  Keep your ears over your shoulders. This will reduce stress on your neck and low back.  INCORRECT SITTING POSTURES  If you are feeling tired and unable to assume a healthy sitting posture, do not slouch or slump. This puts excessive strain on your back tissues, causing more damage and pain. Healthier options include:  Using more support, like a lumbar pillow.  Switching tasks to something that requires you to be upright or walking.  Talking a brief walk.  Lying down to rest in a neutral-spine position.  PROLONGED STANDING WHILE SLIGHTLY LEANING FORWARD  When completing a task that requires you to lean forward while standing in one place for a long time, place either foot up on a stationary 2-4 inch high object to help maintain the best posture. When both feet are on the ground, the lower back tends to lose its slight inward curve. If this curve flattens (or becomes too  large), then the back and your other joints  will experience too much stress, tire more quickly, and can cause pain.  CORRECT STANDING POSTURES Proper standing posture should be assumed with all daily activities, even if they only take a few moments, like when brushing your teeth. As in sitting, your ears should fall over your shoulders and your shoulders should fall over your hips. You should keep a slight tension in your abdominal muscles to brace your spine. Your tailbone should point down to the ground, not behind your body, resulting in an over-extended swayback posture.   INCORRECT STANDING POSTURES  Common incorrect standing postures include a forward head, locked knees and/or an excessive swayback. WALKING Walk with an upright posture. Your ears, shoulders and hips should all line-up.  PROLONGED ACTIVITY IN A FLEXED POSITION When completing a task that requires you to bend forward at your waist or lean over a low surface, try to find a way to stabilize 3 out of 4 of your limbs. You can place a hand or elbow on your thigh or rest a knee on the surface you are reaching across. This will provide you more stability, so that your muscles do not tire as quickly. By keeping your knees relaxed, or slightly bent, you will also reduce stress across your lower back. CORRECT LIFTING TECHNIQUES  DO :  Assume a wide stance. This will provide you more stability and the opportunity to get as close as possible to the object which you are lifting.  Tense your abdominals to brace your spine. Bend at the knees and hips. Keeping your back locked in a neutral-spine position, lift using your leg muscles. Lift with your legs, keeping your back straight.  Test the weight of unknown objects before attempting to lift them.  Try to keep your elbows locked down at your sides in order get the best strength from your shoulders when carrying an object.     Always ask for help when lifting heavy or awkward  objects. INCORRECT LIFTING TECHNIQUES DO NOT:   Lock your knees when lifting, even if it is a small object.  Bend and twist. Pivot at your feet or move your feet when needing to change directions.  Assume that you can safely pick up even a paperclip without proper posture.

## 2018-10-13 NOTE — Progress Notes (Signed)
Musculoskeletal Exam  Patient: Timothy Austin DOB: 1985-10-04  DOS: 10/13/2018  SUBJECTIVE:  Chief Complaint:   Chief Complaint  Patient presents with  . Back Pain    Rudolphe Testerman is a 33 y.o.  male for evaluation and treatment of his back pain.   Onset:  10 days ago. No inj or change in activity.  Location: mid-lower back, b/l Character: pinching Progression of issue:  is unchanged Associated symptoms: movements make pain worse Denies bowel/bladder incontinence or weakness Treatment: to date has been- Tylenol.   Neurovascular symptoms: no  Abd fullness. Has seen Gi in past with uremarkbale workup. EGD was next step that he held off. Has been going on for 2-3 months. No fevers, wt loss, bleeding, nighttime awakenings.   Whistling in his L ear for 2-3 years. Waxes and wanes. Hearing is OK. No pain or drainage. Has used INCS in past, but was years ago.  ROS: Musculoskeletal/Extremities: +back pain Neurologic: no numbness, tingling no weakness   Past Medical History:  Diagnosis Date  . Dizziness   . Hypercholesteremia   . Osteochondritis dissecans of left knee   . Thyroid disease   . Wears glasses     Objective:  VITAL SIGNS: BP 110/80 (BP Location: Left Arm, Patient Position: Sitting, Cuff Size: Large)   Pulse 75   Temp 99 F (37.2 C) (Oral)   Ht 5' 11.5" (1.816 m)   Wt 212 lb 8 oz (96.4 kg)   SpO2 96%   BMI 29.22 kg/m  Constitutional: Well formed, well developed. No acute distress. HENT: Normocephalic, atraumatic. Mild retraction on L TM compared to R, no effusion or erythema Thorax & Lungs:  CTAB, No accessory muscle use Extremities: No clubbing. No cyanosis. No edema.  Skin: Warm. Dry. No erythema. No rash.  Abd: +epigastric ttp, BS+, NT, ND, S, no masses or organomegaly Musculoskeletal: low back.   Tenderness to palpation: yes Deformity: no Ecchymosis: no Straight leg test: negative for Poor hamstring flexibility b/l. Neurologic: Normal sensory  function. No focal deficits noted. DTR's equal and symmetry in LE's. No clonus. Psychiatric: Normal mood. Age appropriate judgment and insight. Alert & oriented x 3.    Assessment:  Dyslipidemia - Plan: Lipid panel, Basic metabolic panel  Epigastric abdominal pain - Plan: H. pylori breath test, pantoprazole (PROTONIX) 40 MG tablet  ETD (Eustachian tube dysfunction), left - Plan: fluticasone (FLONASE) 50 MCG/ACT nasal spray  Acute bilateral low back pain without sciatica  Plan: Orders as above.  F/u w GI, trial PPI per his request. H pylori per his request. INCS for ETD. Pt very concerned about kidney issues. If BMP neg, will call in NSAID. Stretches/exercises, heat, ice, Tylenol.  F/u prn. The patient voiced understanding and agreement to the plan.   Jilda Roche [place], DO 10/13/18  12:10 PM

## 2018-10-14 LAB — H. PYLORI BREATH TEST: H. pylori Breath Test: NOT DETECTED

## 2018-10-20 ENCOUNTER — Encounter: Payer: Self-pay | Admitting: Family Medicine

## 2018-10-20 MED ORDER — SYNTHROID 100 MCG PO TABS: ORAL_TABLET | ORAL | 1 refills | Status: DC

## 2018-10-22 ENCOUNTER — Encounter: Payer: Self-pay | Admitting: Family Medicine

## 2018-10-29 MED ORDER — SYNTHROID 100 MCG PO TABS: ORAL_TABLET | ORAL | 1 refills | Status: DC

## 2018-11-22 ENCOUNTER — Encounter: Payer: PPO | Admitting: Family Medicine

## 2018-11-30 ENCOUNTER — Ambulatory Visit (INDEPENDENT_AMBULATORY_CARE_PROVIDER_SITE_OTHER): Payer: PPO | Admitting: Family Medicine

## 2018-11-30 ENCOUNTER — Other Ambulatory Visit: Payer: Self-pay

## 2018-11-30 ENCOUNTER — Encounter: Payer: Self-pay | Admitting: Family Medicine

## 2018-11-30 DIAGNOSIS — N50812 Left testicular pain: Secondary | ICD-10-CM

## 2018-11-30 NOTE — Progress Notes (Signed)
CC: Testicle pain  Subjective: Patient is a 33 y.o. male here for L testicular pain. Due to outbreak, we are interacting via web portal for an electronic face-to-face visit. I verified patient's ID using 2 identifiers.   Started 2 mo ago, has worsened over past 3 weeks. No inj or change in activity. He is circumcised. No urinary complaints, fevers, swelling, bruising, bowel changes, discharge, new sex partners and he does not practice insertive anal intercourse. He wears boxer shorts, not a runner or cyclist.   ROS: Const- no fevers  Past Medical History:  Diagnosis Date  . Dizziness   . Hypercholesteremia   . Osteochondritis dissecans of left knee   . Thyroid disease   . Wears glasses     Objective: No conversational dyspnea Age appropriate judgment and insight Nml affect and mood Pt had no ttp over testicle  Assessment and Plan: Pain in left testicle - Plan: Urinalysis, Urine cytology ancillary only(Center)  Orders as above. Ck urine. Does not sound like a torsion or epididymitis. Wear supportive undergarments. Ice. NSAIDs. F/u prn.  The patient voiced understanding and agreement to the plan.  Jilda Roche [place], DO 11/30/18  1:15 PM

## 2018-12-08 ENCOUNTER — Other Ambulatory Visit: Payer: Self-pay | Admitting: Family Medicine

## 2018-12-08 DIAGNOSIS — N50819 Testicular pain, unspecified: Secondary | ICD-10-CM

## 2018-12-28 IMAGING — CR DG CHEST 2V
2 series · 2 of 2 positions shown · non-contrast
Comparison: March 04, 2017

CLINICAL DATA: Chest pain

EXAM:
CHEST - 2 VIEW

[w chest pa]
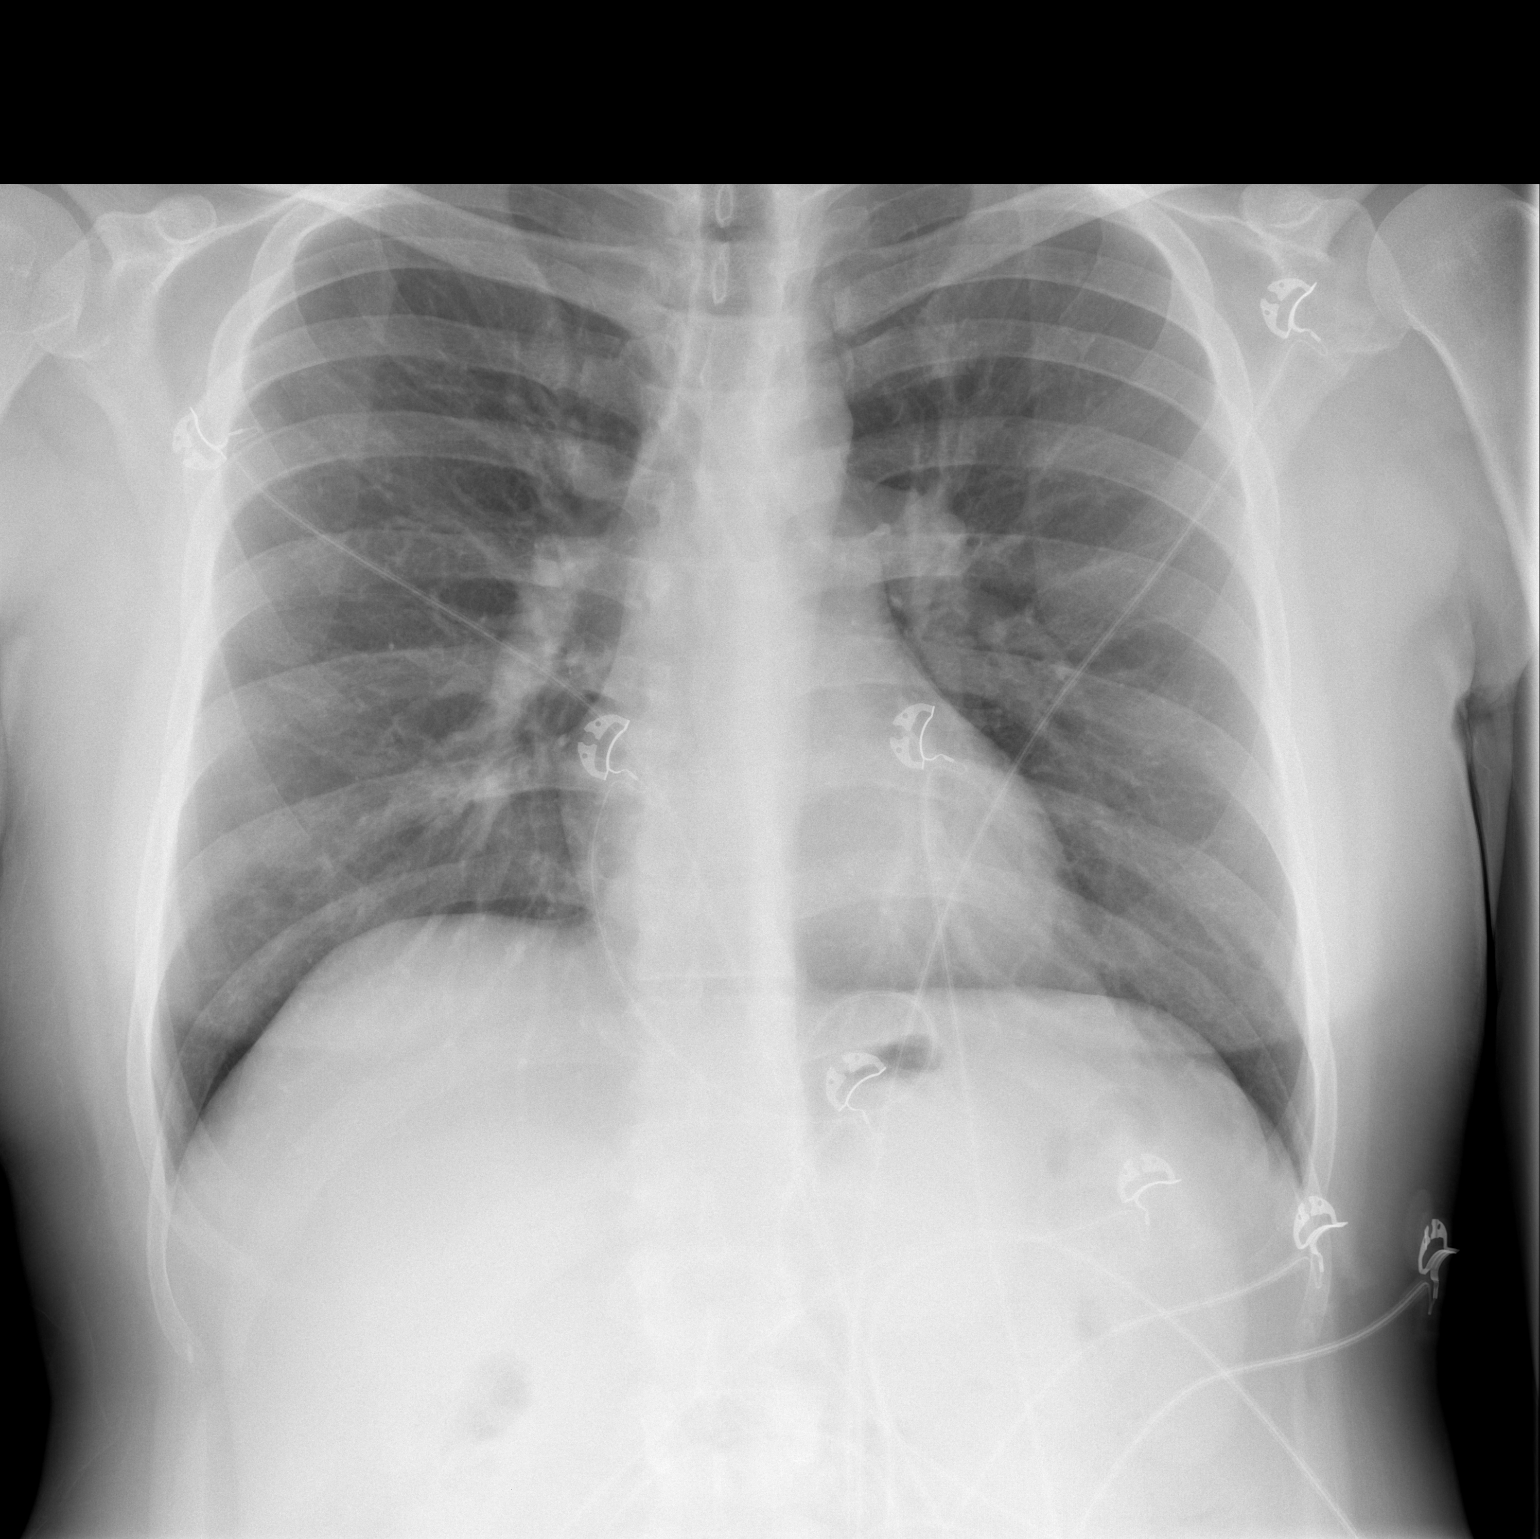

[w chest lat]
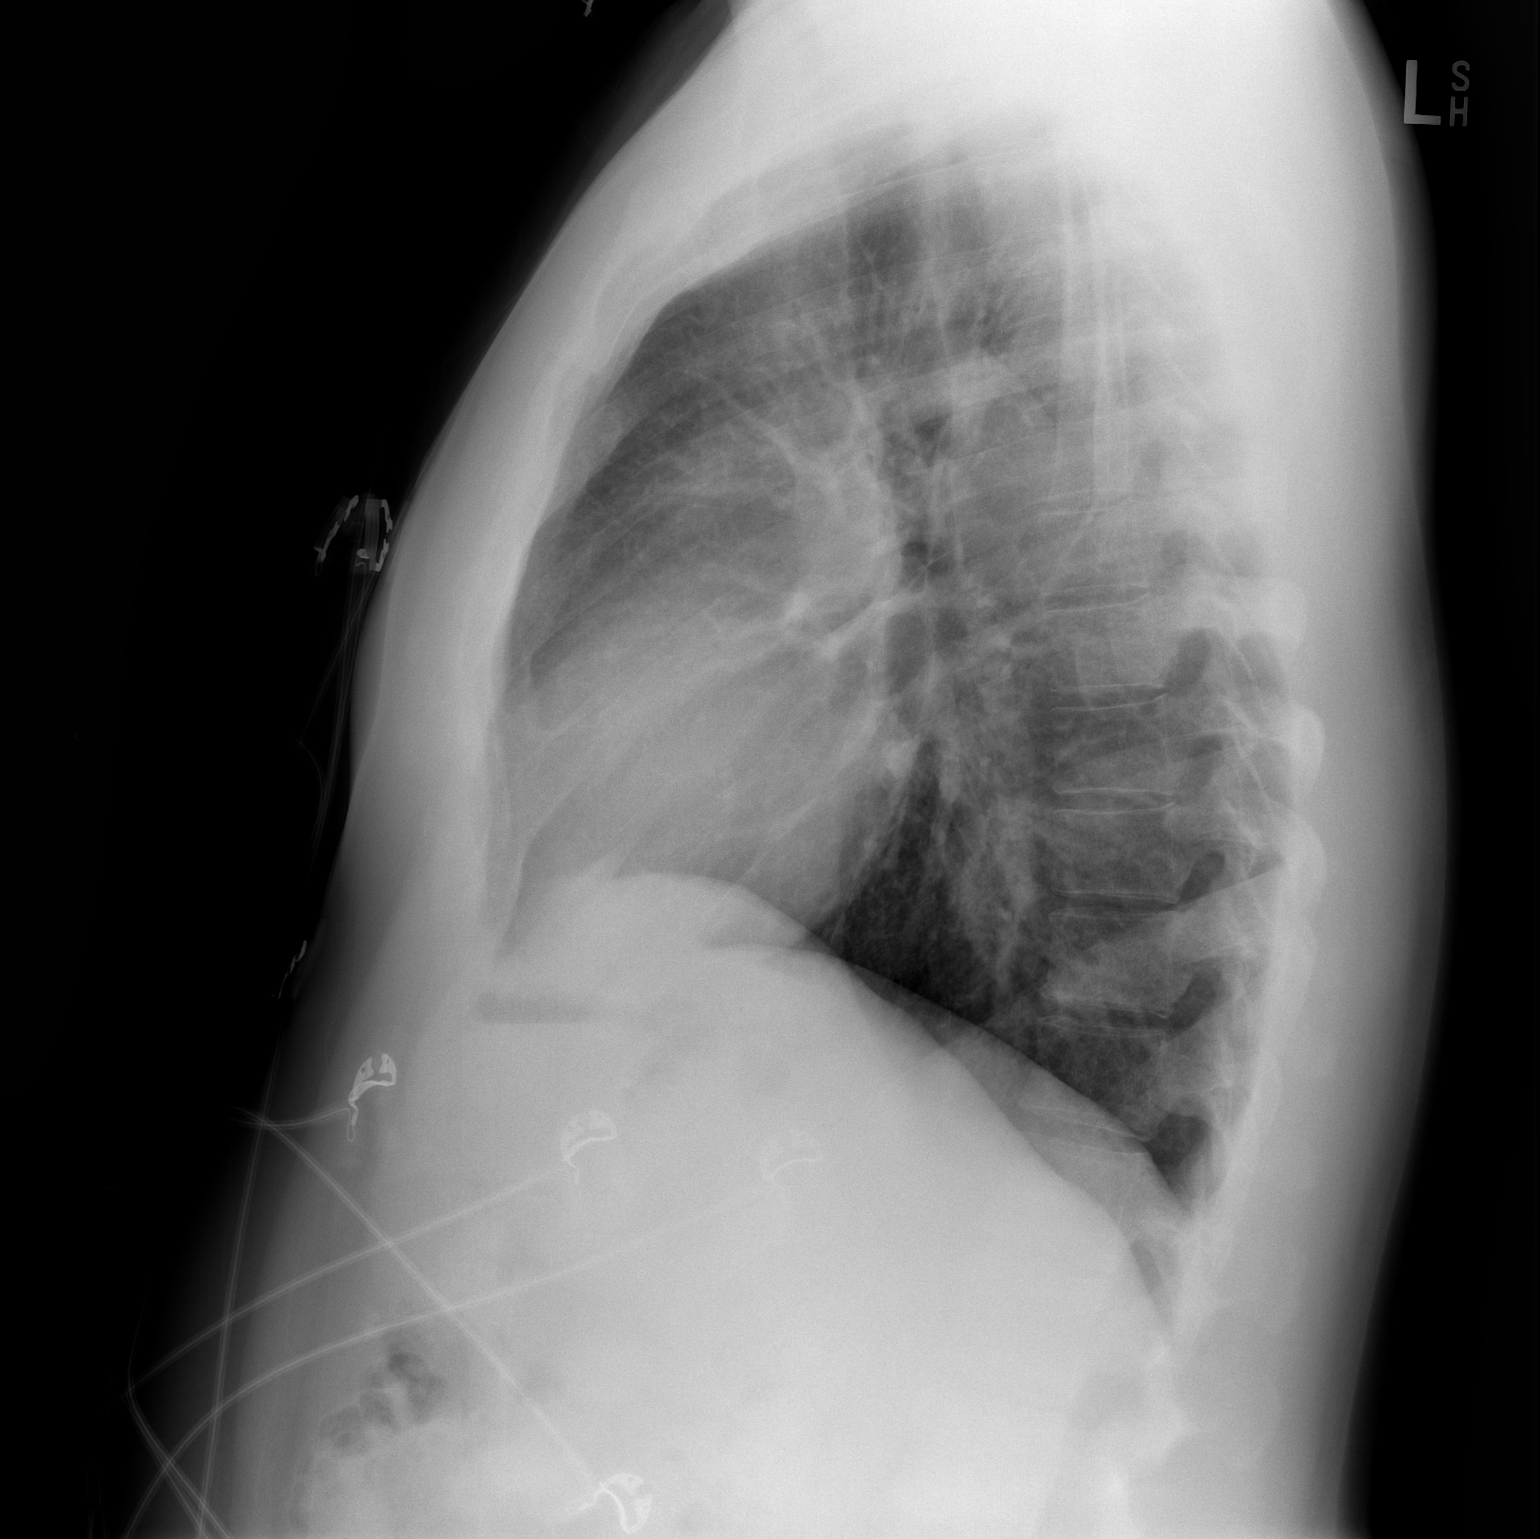

[2 of 2 positions shown; findings below may reference images not displayed]

FINDINGS: No edema or consolidation. The heart size and pulmonary vascularity
are normal. No adenopathy. No pneumothorax. No bone lesions.
IMPRESSION: No edema or consolidation.

## 2019-02-08 ENCOUNTER — Other Ambulatory Visit (HOSPITAL_COMMUNITY)
Admission: RE | Admit: 2019-02-08 | Discharge: 2019-02-08 | Disposition: A | Discharge status: Home / Self Care | Payer: PPO | Source: Ambulatory Visit | Attending: Family Medicine | Admitting: Family Medicine

## 2019-02-08 ENCOUNTER — Other Ambulatory Visit: Payer: Self-pay

## 2019-02-08 ENCOUNTER — Other Ambulatory Visit (INDEPENDENT_AMBULATORY_CARE_PROVIDER_SITE_OTHER): Payer: PPO

## 2019-02-08 DIAGNOSIS — N50812 Left testicular pain: Secondary | ICD-10-CM | POA: Insufficient documentation

## 2019-02-08 DIAGNOSIS — N50819 Testicular pain, unspecified: Secondary | ICD-10-CM

## 2019-02-08 LAB — URINALYSIS, ROUTINE W REFLEX MICROSCOPIC
Bilirubin Urine: NEGATIVE
Hgb urine dipstick: NEGATIVE
Ketones, ur: NEGATIVE
Leukocytes,Ua: NEGATIVE
Nitrite: NEGATIVE
RBC / HPF: NONE SEEN (ref 0–?)
Specific Gravity, Urine: >=1.03 — AB (ref 1.000–1.030)
Total Protein, Urine: NEGATIVE
Urine Glucose: NEGATIVE
Urobilinogen, UA: 0.2 (ref 0.0–1.0)
pH: 6 (ref 5.0–8.0)

## 2019-02-09 LAB — URINE CYTOLOGY ANCILLARY ONLY
Chlamydia: NEGATIVE
Neisseria Gonorrhea: NEGATIVE
Trichomonas: NEGATIVE

## 2019-05-02 ENCOUNTER — Telehealth: Payer: Self-pay | Admitting: Family Medicine

## 2019-05-02 ENCOUNTER — Other Ambulatory Visit: Payer: Self-pay

## 2019-05-02 NOTE — Telephone Encounter (Signed)
RF request for synthroid.   Last TSH was 05/20/2018. No upcoming thyroid follow up. Okay to fill?  Please advise.

## 2019-05-02 NOTE — Telephone Encounter (Signed)
Pt is completely out of medication (SYNTHROID 100 MCG tablet) and the pharmacy told him they have notified the doctor already. I do not see anything in Epic. Pt states that the insurance will cover generic levothyroxine 170mcg. Please advise if this can be sent today.   [facility] [address]

## 2019-05-03 ENCOUNTER — Encounter: Payer: Self-pay | Admitting: Internal Medicine

## 2019-05-03 ENCOUNTER — Ambulatory Visit (INDEPENDENT_AMBULATORY_CARE_PROVIDER_SITE_OTHER): Payer: PPO | Admitting: Internal Medicine

## 2019-05-03 DIAGNOSIS — R1013 Epigastric pain: Secondary | ICD-10-CM | POA: Diagnosis not present

## 2019-05-03 NOTE — Progress Notes (Signed)
Subjective:    Patient ID: Timothy Austin, male    DOB: 1985-11-12, 33 y.o.   MRN: [id number]  DOS:  05/03/2019 Type of visit - description: Virtual Visit via Video Note  I connected with@   by a video enabled telemedicine application and verified that I am speaking with the correct person using two identifiers.   THIS ENCOUNTER IS A VIRTUAL VISIT DUE TO COVID-19 - PATIENT WAS NOT SEEN IN THE OFFICE. PATIENT HAS CONSENTED TO VIRTUAL VISIT / TELEMEDICINE VISIT   Location of patient: home  Location of provider: office  I discussed the limitations of evaluation and management by telemedicine and the availability of in person appointments. The patient expressed understanding and agreed to proceed.  History of Present Illness:   Acute The main complaint of the patient today is abdominal pain, located at the epigastric area -by the xiphoid- sometimes radiates to the left, increased by eating, occasionally he feels bloated. On self palpation he feels the area is tender probably asymmetric (larger) compared to the right side. This problem started according to the patient 20 years ago.   Review of Systems Denies fever chills No weight loss No nausea, vomiting, diarrhea. No recent injury No blood in the stools   Past Medical History:  Diagnosis Date  . Dizziness   . Hypercholesteremia   . Osteochondritis dissecans of left knee   . Thyroid disease   . Wears glasses     Past Surgical History:  Procedure Laterality Date  . APPENDECTOMY  age 11  . CHONDROPLASTY Left 03/06/2015   Procedure: CHONDROPLASTY;  Surgeon: Sydnee Cabal, MD;  Location: [facility];  Service: Orthopedics;  Laterality: Left;  . KNEE ARTHROSCOPY Left 03/06/2015   Procedure: ARTHROSCOPY KNEE DEBRIDEMENT  of OCD LESION  AND HARVEST CARTILAGE;  Surgeon: Sydnee Cabal, MD;  Location: [facility];  Service: Orthopedics;  Laterality: Left;    Social History   Socioeconomic History   . Marital status: Married    Spouse name: Not on file  . Number of children: Not on file  . Years of education: Not on file  . Highest education level: Not on file  Occupational History  . Not on file  Social Needs  . Financial resource strain: Not on file  . Food insecurity    Worry: Not on file    Inability: Not on file  . Transportation needs    Medical: Not on file    Non-medical: Not on file  Tobacco Use  . Smoking status: Never Smoker  . Smokeless tobacco: Never Used  Substance and Sexual Activity  . Alcohol use: No  . Drug use: No  . Sexual activity: Not on file  Lifestyle  . Physical activity    Days per week: Not on file    Minutes per session: Not on file  . Stress: Not on file  Relationships  . Social Herbalist on phone: Not on file    Gets together: Not on file    Attends religious service: Not on file    Active member of club or organization: Not on file    Attends meetings of clubs or organizations: Not on file    Relationship status: Not on file  . Intimate partner violence    Fear of current or ex partner: Not on file    Emotionally abused: Not on file    Physically abused: Not on file    Forced sexual activity: Not on  file  Other Topics Concern  . Not on file  Social History Narrative  . Not on file      Allergies as of 05/03/2019   No Known Allergies     Medication List       Accurate as of May 03, 2019  3:00 PM. If you have any questions, ask your nurse or doctor.        fluticasone 50 MCG/ACT nasal spray Commonly known as: FLONASE Place 2 sprays into both nostrils daily.   pantoprazole 40 MG tablet Commonly known as: PROTONIX Take 1 tablet (40 mg total) by mouth daily.   Synthroid 100 MCG tablet Generic drug: levothyroxine Take 1 tablet daily           Objective:   Physical Exam There were no vitals taken for this visit. This is a virtual video visit, he is alert oriented x3, no apparent distress     Assessment     Chronic abdominal pain: As described above, on reviewing of the chart this has been extensively evaluated in the past : CAT scans of the abdomen, a visit to GI, etc. He is on Protonix. I recommend patient to see PCP who knows the patient well to see if further work-up or a different treatment is needed. This needs to be in the context of in person visit so PCP is able to examine the patient. Will call and set up an appointment.   I discussed the assessment and treatment plan with the patient. The patient was provided an opportunity to ask questions and all were answered. The patient agreed with the plan and demonstrated an understanding of the instructions.   The patient was advised to call back or seek an in-person evaluation if the symptoms worsen or if the condition fails to improve as anticipated.

## 2019-05-03 NOTE — Telephone Encounter (Signed)
Patient is scheduled today with Dr. Larose Kells?

## 2019-05-04 ENCOUNTER — Other Ambulatory Visit: Payer: Self-pay

## 2019-05-04 ENCOUNTER — Other Ambulatory Visit: Payer: Self-pay | Admitting: Family Medicine

## 2019-05-04 MED ORDER — SYNTHROID 100 MCG PO TABS: ORAL_TABLET | ORAL | 0 refills | Status: DC

## 2019-05-04 NOTE — Telephone Encounter (Signed)
Called left detailed message. Sent in 30 day refill

## 2019-05-04 NOTE — Telephone Encounter (Signed)
Called again line busy

## 2019-05-04 NOTE — Telephone Encounter (Signed)
He saw Dr. Larose Kells for an acute issue. I think he is due for his CPE, let's schedule that and we can check his thyroid levels. OK to fill for 30 days for now. Ty.

## 2019-05-05 NOTE — Telephone Encounter (Signed)
appt scheduled for 05/06/2019

## 2019-05-06 ENCOUNTER — Encounter: Payer: Self-pay | Admitting: Family Medicine

## 2019-05-06 ENCOUNTER — Other Ambulatory Visit: Payer: Self-pay

## 2019-05-06 ENCOUNTER — Ambulatory Visit (INDEPENDENT_AMBULATORY_CARE_PROVIDER_SITE_OTHER): Payer: PPO | Admitting: Family Medicine

## 2019-05-06 VITALS — BP 108/68 | HR 68 | Temp 97.8°F | Ht 69.0 in | Wt 214.5 lb

## 2019-05-06 DIAGNOSIS — E039 Hypothyroidism, unspecified: Secondary | ICD-10-CM

## 2019-05-06 DIAGNOSIS — N50819 Testicular pain, unspecified: Secondary | ICD-10-CM | POA: Diagnosis not present

## 2019-05-06 DIAGNOSIS — R1013 Epigastric pain: Secondary | ICD-10-CM

## 2019-05-06 MED ORDER — LEVOTHYROXINE SODIUM 100 MCG PO TABS: 100.0000 ug | ORAL_TABLET | Freq: Every day | ORAL | 3 refills | Status: DC

## 2019-05-06 MED ORDER — PANTOPRAZOLE SODIUM 40 MG PO TBEC: 40.0000 mg | DELAYED_RELEASE_TABLET | Freq: Every day | ORAL | 3 refills | Status: DC

## 2019-05-06 MED ORDER — DICYCLOMINE HCL 10 MG PO CAPS: 10.0000 mg | ORAL_CAPSULE | Freq: Three times a day (TID) | ORAL | 1 refills | Status: DC

## 2019-05-06 NOTE — Progress Notes (Signed)
Chief Complaint  Patient presents with  . Follow-up    pain when eating    Subjective: Patient is a 33 y.o. male here for abd pain.  Has seen GI in past. RUQ US unremarkable as well as CT abd/pelvis. Saw GI, Dr. Ardis Hughs decided not to proceed with endo in conversation w pt. Pt has failed numerous PPIs. Started Protonix again and did well. Requesting refill. 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 Also would like a refill of Bentyl. Having migrating pains of abd region as well. Gas-X was not helpful.   L testicular pain continues. He got athletic boxer briefs and his pain did improve. Will get worse with exercise. No urinary complaints.  Thyroid medication not covered by ins, needs generic called in. 100 mcg/d, compliant, no AE's.    ROS: GU: +testicular pain GI: As noted in HPI  Past Medical History:  Diagnosis Date  . Dizziness   . Hypercholesteremia   . Osteochondritis dissecans of left knee   . Thyroid disease   . Wears glasses     Objective: BP 108/68 (BP Location: Left Arm, Patient Position: Sitting, Cuff Size: Large)   Pulse 68   Temp 97.8 F (36.6 C) (Temporal)   Ht 5\' 9"  (1.753 m)   Wt 214 lb 8 oz (97.3 kg)   SpO2 98%   BMI 31.68 kg/m  General: Awake, appears stated age HEENT: MMM, EOMi Heart: RRR, no murmurs Lungs: CTAB, no rales, wheezes or rhonchi. No accessory muscle use Abd: BS+, s, nt, nd Psych: Age appropriate judgment and insight, normal affect and mood  Assessment and Plan: Testicle pain  Epigastric abdominal pain - Plan: dicyclomine (BENTYL) 10 MG capsule, pantoprazole (PROTONIX) 40 MG tablet  Hypothyroidism, unspecified type - Plan: TSH, T4, free  1- wear a jock strap, if no improvement, will refer to uro 2- refills as above. Rec'd BeanO for likely gas pains. 3- levothyroxine called in, ck labs.  F/u in 6 weeks for CPE or prn.  The patient voiced understanding and agreement to the plan.  [place], DO 05/06/19  3:09 PM

## 2019-05-06 NOTE — Patient Instructions (Addendum)
Try Bean-O to help with possible gas trapping that could be causing migrating pain.   The only lifestyle changes that have data behind them are weight loss for the overweight/obese and elevating the head of the bed. Finding out which foods/positions are triggers is important.  Try a jock strap to help with the testicle issue. If no improvement over the next couple weeks, let me know.  Give [place] 2-3 business days to get the results of your labs back.   Let us know if you need anything.

## 2019-05-07 LAB — TSH: TSH: 12.34 mIU/L — ABNORMAL HIGH (ref 0.40–4.50)

## 2019-05-07 LAB — T4, FREE: Free T4: 1.1 ng/dL (ref 0.8–1.8)

## 2019-05-31 ENCOUNTER — Ambulatory Visit (INDEPENDENT_AMBULATORY_CARE_PROVIDER_SITE_OTHER): Payer: PPO | Admitting: Physician Assistant

## 2019-05-31 ENCOUNTER — Encounter: Payer: Self-pay | Admitting: Physician Assistant

## 2019-05-31 VITALS — BP 108/66 | HR 84 | Temp 97.9°F | Wt 211.0 lb

## 2019-05-31 DIAGNOSIS — R1013 Epigastric pain: Secondary | ICD-10-CM | POA: Diagnosis not present

## 2019-05-31 DIAGNOSIS — Z1159 Encounter for screening for other viral diseases: Secondary | ICD-10-CM | POA: Diagnosis not present

## 2019-05-31 NOTE — Progress Notes (Signed)
Chief Complaint: Abdominal pain  Review of pertinent gastrointestinal problems: 1. Abdominal pains. Blood tests 2019 normal CBC, slightly low potassium but otherwise normal complete metabolic profile.  H. pylori breath test October 2018 was negative a year prior it was checked and it was also negative.  Pancreatic enzymes were normal. Abdominal ultrasound June 2019 was "within normal limits". Ct scan abd/pelv with IV and oral contrast 02/2018: normal except for small left inguinal hernia containing fat only.  10/13/2018 H. pylori breath test negative  HPI:    [name] is a 33 year old male, known to Dr. Christella Hartigan, who was referred to me by Sharlene Dory* for a complaint of abdominal pain.      05/04/2018 office visit with Dr. Christella Hartigan.  At that time describe taking PPI daily for several weeks and stopped taking it.  He had a CT scan which was normal and was not bothered by his left upper quadrant discomfort.  Still felt a lump up in his left upper quadrant that was bothersome and felt gassy at times.  It was discussed that he is likely feeling some intermittent trapped gas.  He was reassured given normal labs ultrasound and CT scan.  At that time I discussed the next up in evaluation would generally be an upper endoscopy but at that time was not felt this is necessary.  It was recommended that he call if he come became bothered by this symptom and Dr. Christella Hartigan to proceed with upper endoscopy.    10/13/2018 H. pylori breath test negative.    Today, the patient presents clinic and tells me that his left upper quadrant pain has gotten worse and is actually now more in the center of his stomach.  Tells me it is worse when he eats just about anything.  It has increased over the past 2 months.  Was given Protonix 40 mg daily by his PCP which does help some.  This pain seems to radiate to both sides of his abdomen.  Patient wonders if he possibly has celiac disease given his gas.  Does describe radiating  more towards constipation now and using MiraLAX occasionally for bowel movements.    Denies fever, chills, weight loss, blood in his stool, nausea or vomiting.     Past Medical History:  Diagnosis Date  . Dizziness   . Hypercholesteremia   . Osteochondritis dissecans of left knee   . Thyroid disease   . Wears glasses     Past Surgical History:  Procedure Laterality Date  . APPENDECTOMY  age 19  . CHONDROPLASTY Left 03/06/2015   Procedure: CHONDROPLASTY;  Surgeon: Eugenia Mcalpine, MD;  Location: [facility];  Service: Orthopedics;  Laterality: Left;  . KNEE ARTHROSCOPY Left 03/06/2015   Procedure: ARTHROSCOPY KNEE DEBRIDEMENT  of OCD LESION  AND HARVEST CARTILAGE;  Surgeon: Eugenia Mcalpine, MD;  Location: [facility];  Service: Orthopedics;  Laterality: Left;    Current Outpatient Medications  Medication Sig Dispense Refill  . dicyclomine (BENTYL) 10 MG capsule Take 1 capsule (10 mg total) by mouth 4 (four) times daily -  before meals and at bedtime. 90 capsule 1  . fluticasone (FLONASE) 50 MCG/ACT nasal spray Place 2 sprays into both nostrils daily. 16 g 2  . levothyroxine (SYNTHROID) 100 MCG tablet Take 1 tablet (100 mcg total) by mouth daily before breakfast. 30 tablet 3  . pantoprazole (PROTONIX) 40 MG tablet Take 1 tablet (40 mg total) by mouth daily. 30 tablet 3  No current facility-administered medications for this visit.     Allergies as of 05/31/2019  . (No Known Allergies)    Family History  Problem Relation Age of Onset  . Hypertension Mother   . Diabetes Mother   . Hypertension Father   . Diabetes Father     Social History   Socioeconomic History  . Marital status: Married    Spouse name: Not on file  . Number of children: Not on file  . Years of education: Not on file  . Highest education level: Not on file  Occupational History  . Not on file  Social Needs  . Financial resource strain: Not on file  . Food insecurity     Worry: Not on file    Inability: Not on file  . Transportation needs    Medical: Not on file    Non-medical: Not on file  Tobacco Use  . Smoking status: Never Smoker  . Smokeless tobacco: Never Used  Substance and Sexual Activity  . Alcohol use: No  . Drug use: No  . Sexual activity: Not on file  Lifestyle  . Physical activity    Days per week: Not on file    Minutes per session: Not on file  . Stress: Not on file  Relationships  . Social Herbalist on phone: Not on file    Gets together: Not on file    Attends religious service: Not on file    Active member of club or organization: Not on file    Attends meetings of clubs or organizations: Not on file    Relationship status: Not on file  . Intimate partner violence    Fear of current or ex partner: Not on file    Emotionally abused: Not on file    Physically abused: Not on file    Forced sexual activity: Not on file  Other Topics Concern  . Not on file  Social History Narrative  . Not on file    Review of Systems:    Constitutional: No weight loss, fever or chills Cardiovascular: No chest pain Respiratory: No SOB Gastrointestinal: See HPI and otherwise negative   Physical Exam:  Vital signs: BP 108/66   Pulse 84   Temp 97.9 F (36.6 C)   Wt 211 lb (95.7 kg)   BMI 31.16 kg/m   Constitutional:   Pleasant male appears to be in NAD, Well developed, Well nourished, alert and cooperative Respiratory: Respirations even and unlabored. Lungs clear to auscultation bilaterally.   No wheezes, crackles, or rhonchi.  Cardiovascular: Normal S1, S2. No MRG. Regular rate and rhythm. No peripheral edema, cyanosis or pallor.  Gastrointestinal:  Soft, nondistended,mild epigastric ttp. No rebound or guarding. Normal bowel sounds. No appreciable masses or hepatomegaly. Rectal:  Not performed.  Psychiatric: Demonstrates good judgement and reason without abnormal affect or behaviors.  See HPI for most recent labs.   Assessment: 1.  Epigastric pain: Left upper quadrant pain is now centered more in his epigastrium it does radiate to both sides of his abdomen, worse over the past 2 months, some better with Pantoprazole, H. pylori breath test and February negative, per Dr. Ardis Hughs next up as an EGD for further evaluation of possible gastritis versus PUD versus other  Plan: 1.  Scheduled patient for an EGD in the [place] with Dr. Ardis Hughs.  Did discuss risks, benefits, limitations and alternatives the patient agrees to proceed. 2.  Continue Pantoprazole 40 mg daily, 30-60 minutes before  breakfast. 3.  Patient to follow in clinic per recommendations from Dr. Christella HartiganJacobs after time of procedure.  Hyacinth MeekerJennifer Edla Para, PA-C [facility] 05/31/2019, 1:32 PM  Cc: Sharlene DoryWendling, Nicholas Paul*

## 2019-05-31 NOTE — Progress Notes (Signed)
I agree with the above note, plan 

## 2019-05-31 NOTE — Patient Instructions (Signed)
If you are age 34 or older, your body mass index should be between 23-30. Your Body mass index is 31.16 kg/m. If this is out of the aforementioned range listed, please consider follow up with your Primary Care Provider.  If you are age 58 or younger, your body mass index should be between 19-25. Your Body mass index is 31.16 kg/m. If this is out of the aformentioned range listed, please consider follow up with your Primary Care Provider.    You have been scheduled for an endoscopy. Please follow written instructions given to you at your visit today. If you use inhalers (even only as needed), please bring them with you on the day of your procedure.  Continue Pantoprazole once daily.   Thank you for choosing me and [facility].  Dennison Bulla

## 2019-06-01 ENCOUNTER — Other Ambulatory Visit: Payer: Self-pay

## 2019-06-16 ENCOUNTER — Other Ambulatory Visit: Payer: Self-pay

## 2019-06-17 ENCOUNTER — Other Ambulatory Visit: Payer: Self-pay | Admitting: Family Medicine

## 2019-06-17 ENCOUNTER — Encounter: Payer: PPO | Admitting: Family Medicine

## 2019-06-17 ENCOUNTER — Encounter: Payer: Self-pay | Admitting: Gastroenterology

## 2019-06-17 ENCOUNTER — Other Ambulatory Visit: Payer: Self-pay | Admitting: Gastroenterology

## 2019-06-17 DIAGNOSIS — R1013 Epigastric pain: Secondary | ICD-10-CM

## 2019-06-17 LAB — SARS CORONAVIRUS 2 (TAT 6-24 HRS): SARS Coronavirus 2: NEGATIVE

## 2019-06-21 ENCOUNTER — Encounter: Payer: Self-pay | Admitting: Gastroenterology

## 2019-06-21 ENCOUNTER — Other Ambulatory Visit: Payer: Self-pay

## 2019-06-21 ENCOUNTER — Ambulatory Visit (AMBULATORY_SURGERY_CENTER): Payer: PPO | Admitting: Gastroenterology

## 2019-06-21 VITALS — BP 100/50 | HR 63 | Temp 98.8°F | Resp 14 | Ht 69.0 in | Wt 211.0 lb

## 2019-06-21 DIAGNOSIS — R1013 Epigastric pain: Secondary | ICD-10-CM

## 2019-06-21 DIAGNOSIS — K2970 Gastritis, unspecified, without bleeding: Secondary | ICD-10-CM

## 2019-06-21 MED ORDER — SODIUM CHLORIDE 0.9 % IV SOLN: 500.0000 mL | Freq: Once | INTRAVENOUS | Status: DC

## 2019-06-21 NOTE — Progress Notes (Signed)
To PACU VSS. Report to [email or web address] 

## 2019-06-21 NOTE — Op Note (Signed)
Timothy Austin Patient Name: Timothy Austin Procedure Date: 06/21/2019 3:00 PM MRN: [id number] Endoscopist: Milus Banister , MD Age: 33 Referring MD:  Date of Birth: 02/09/1986 Gender: Male Account #: [id number] Procedure:                Upper GI endoscopy Indications:              Dyspepsia, Indigestion Medicines:                Monitored Anesthesia Care Procedure:                Pre-Anesthesia Assessment:                           - Prior to the procedure, a History and Physical                            was performed, and patient medications and                            allergies were reviewed. The patient's tolerance of                            previous anesthesia was also reviewed. The risks                            and benefits of the procedure and the sedation                            options and risks were discussed with the patient.                            All questions were answered, and informed consent                            was obtained. Prior Anticoagulants: The patient has                            taken no previous anticoagulant or antiplatelet                            agents. ASA Grade Assessment: II - A patient with                            mild systemic disease. After reviewing the risks                            and benefits, the patient was deemed in                            satisfactory condition to undergo the procedure.                           After obtaining informed consent, the endoscope was  passed under direct vision. Throughout the                            procedure, the patient's blood pressure, pulse, and                            oxygen saturations were monitored continuously. The                            Endoscope was introduced through the mouth, and                            advanced to the second part of duodenum. The upper                            GI endoscopy was accomplished  without difficulty.                            The patient tolerated the procedure well. Scope In: Scope Out: Findings:                 Mild inflammation characterized by erythema and                            granularity was found in the gastric antrum.                            Biopsies were taken with a cold forceps for                            histology.                           The exam was otherwise without abnormality. Complications:            No immediate complications. Estimated blood loss:                            None. Estimated Blood Loss:     Estimated blood loss: none. Impression:               - Gastritis. Biopsied.                           - The examination was otherwise normal. Recommendation:           - Patient has a contact number available for                            emergencies. The signs and symptoms of potential                            delayed complications were discussed with the                            patient. Return to normal activities tomorrow.  Written discharge instructions were provided to the                            patient.                           - Resume previous diet.                           - Continue present medications.                           - Await pathology results. Rachael Fee, MD 06/21/2019 3:19:46 PM This report has been signed electronically.

## 2019-06-21 NOTE — Patient Instructions (Signed)
Read all of the handouts given to you by your recovery room nurse.  Thank-you for choosing su for your healthcare needs today.  YOU HAD AN ENDOSCOPIC PROCEDURE TODAY AT [facility]:   Refer to the procedure report that was given to you for any specific questions about what was found during the examination.  If the procedure report does not answer your questions, please call your gastroenterologist to clarify.  If you requested that your care partner not be given the details of your procedure findings, then the procedure report has been included in a sealed envelope for you to review at your convenience later.  YOU SHOULD EXPECT: Some feelings of bloating in the abdomen. Passage of more gas than usual.  Walking can help get rid of the air that was put into your GI tract during the procedure and reduce the bloating.   Please Note:  You might notice some irritation and congestion in your nose or some drainage.  This is from the oxygen used during your procedure.  There is no need for concern and it should clear up in a day or so.  SYMPTOMS TO REPORT IMMEDIATELY:   Following upper endoscopy (EGD)  Vomiting of blood or coffee ground material  New chest pain or pain under the shoulder blades  Painful or persistently difficult swallowing  New shortness of breath  Fever of 100F or higher  Black, tarry-looking stools  For urgent or emergent issues, a gastroenterologist can be reached at any hour by calling [phone].   DIET:  We do recommend a small meal at first, but then you may proceed to your regular diet.  Drink plenty of fluids but you should avoid alcoholic beverages for 24 hours.  ACTIVITY:  You should plan to take it easy for the rest of today and you should NOT DRIVE or use heavy machinery until tomorrow (because of the sedation medicines used during the test).    FOLLOW UP: Our staff will call the number listed on your records 48-72 hours following your procedure  to check on you and address any questions or concerns that you may have regarding the information given to you following your procedure. If we do not reach you, we will leave a message.  We will attempt to reach you two times.  During this call, we will ask if you have developed any symptoms of COVID 19. If you develop any symptoms (ie: fever, flu-like symptoms, shortness of breath, cough etc.) before then, please call [phone].  If you test positive for Covid 19 in the 2 weeks post procedure, please call and report this information to [place].    If any biopsies were taken you will be contacted by phone or by letter within the next 1-3 weeks.  Please call us at [phone] if you have not heard about the biopsies in 3 weeks.    SIGNATURES/CONFIDENTIALITY: You and/or your care partner have signed paperwork which will be entered into your electronic medical record.  These signatures attest to the fact that that the information above on your After Visit Summary has been reviewed and is understood.  Full responsibility of the confidentiality of this discharge information lies with you and/or your care-partner.

## 2019-06-21 NOTE — Progress Notes (Signed)
Called to room to assist during endoscopic procedure.  Patient ID and intended procedure confirmed with present staff. Received instructions for my participation in the procedure from the performing physician.  

## 2019-06-23 ENCOUNTER — Telehealth: Payer: Self-pay | Admitting: *Deleted

## 2019-06-23 NOTE — Telephone Encounter (Signed)
  Follow up Call-  Call back number 06/21/2019  Post procedure Call Back phone  # [phone]  Permission to leave phone message Yes  Some recent data might be hidden     Patient questions:  Phone rang and then was disconnected.

## 2019-06-23 NOTE — Telephone Encounter (Signed)
  Follow up Call-  Call back number 06/21/2019  Post procedure Call Back phone  # [phone]  Permission to leave phone message Yes  Some recent data might be hidden    LMOM to call back with any questions or concerns.  Also, call back if patient has developed fever, respiratory issues or been dx with COVID or had any family members or close contacts diagnosed since her procedure.

## 2019-06-27 ENCOUNTER — Encounter: Payer: Self-pay | Admitting: Gastroenterology

## 2019-06-27 ENCOUNTER — Ambulatory Visit (INDEPENDENT_AMBULATORY_CARE_PROVIDER_SITE_OTHER): Payer: PPO | Admitting: Family Medicine

## 2019-06-27 ENCOUNTER — Other Ambulatory Visit: Payer: Self-pay

## 2019-06-27 ENCOUNTER — Encounter: Payer: Self-pay | Admitting: Family Medicine

## 2019-06-27 VITALS — BP 108/82 | HR 75 | Temp 97.5°F | Ht 69.5 in | Wt 208.0 lb

## 2019-06-27 DIAGNOSIS — N50812 Left testicular pain: Secondary | ICD-10-CM | POA: Diagnosis not present

## 2019-06-27 DIAGNOSIS — E559 Vitamin D deficiency, unspecified: Secondary | ICD-10-CM | POA: Diagnosis not present

## 2019-06-27 DIAGNOSIS — E538 Deficiency of other specified B group vitamins: Secondary | ICD-10-CM | POA: Diagnosis not present

## 2019-06-27 DIAGNOSIS — H9202 Otalgia, left ear: Secondary | ICD-10-CM

## 2019-06-27 DIAGNOSIS — Z0000 Encounter for general adult medical examination without abnormal findings: Secondary | ICD-10-CM

## 2019-06-27 LAB — COMPREHENSIVE METABOLIC PANEL
ALT: 44 U/L (ref 0–53)
AST: 26 U/L (ref 0–37)
Albumin: 4.8 g/dL (ref 3.5–5.2)
Alkaline Phosphatase: 64 U/L (ref 39–117)
BUN: 18 mg/dL (ref 6–23)
CO2: 27 mEq/L (ref 19–32)
Calcium: 9.4 mg/dL (ref 8.4–10.5)
Chloride: 104 mEq/L (ref 96–112)
Creatinine, Ser: 0.79 mg/dL (ref 0.40–1.50)
GFR: 112.31 mL/min (ref >60.00)
Glucose, Bld: 93 mg/dL (ref 70–99)
Potassium: 4 mEq/L (ref 3.5–5.1)
Sodium: 139 mEq/L (ref 135–145)
Total Bilirubin: 0.5 mg/dL (ref 0.2–1.2)
Total Protein: 7.6 g/dL (ref 6.0–8.3)

## 2019-06-27 LAB — LIPID PANEL
Cholesterol: 203 mg/dL — ABNORMAL HIGH (ref 0–200)
HDL: 33.3 mg/dL — ABNORMAL LOW (ref >39.00)
LDL Cholesterol: 138 mg/dL — ABNORMAL HIGH (ref 0–99)
NonHDL: 169.86
Total CHOL/HDL Ratio: 6
Triglycerides: 159 mg/dL — ABNORMAL HIGH (ref 0.0–149.0)
VLDL: 31.8 mg/dL (ref 0.0–40.0)

## 2019-06-27 LAB — VITAMIN D 25 HYDROXY (VIT D DEFICIENCY, FRACTURES): VITD: 12.54 ng/mL — ABNORMAL LOW (ref 30.00–100.00)

## 2019-06-27 LAB — CBC
HCT: 40.2 % (ref 39.0–52.0)
Hemoglobin: 13.6 g/dL (ref 13.0–17.0)
MCHC: 33.7 g/dL (ref 30.0–36.0)
MCV: 88.7 fl (ref 78.0–100.0)
Platelets: 184 10*3/uL (ref 150.0–400.0)
RBC: 4.53 Mil/uL (ref 4.22–5.81)
RDW: 12.9 % (ref 11.5–15.5)
WBC: 6.2 10*3/uL (ref 4.0–10.5)

## 2019-06-27 LAB — VITAMIN B12: Vitamin B-12: 317 pg/mL (ref 211–911)

## 2019-06-27 LAB — MAGNESIUM: Magnesium: 2.1 mg/dL (ref 1.5–2.5)

## 2019-06-27 LAB — HEMOGLOBIN A1C: Hgb A1c MFr Bld: 5.4 % (ref 4.6–6.5)

## 2019-06-27 NOTE — Progress Notes (Signed)
Chief Complaint  Patient presents with  . Annual Exam    Well Male Timothy Austin is here for a complete physical.   His last physical was >1 year ago.  Current diet: in general, diet could be better.   Current exercise: walking Weight trend: stable Daytime fatigue? No. Seat belt? Yes.    Health maintenance Tetanus- Yes HIV- Yes  Patient has a history of left testicular pain for the past day.  No injury.  Urinating normally.  Denies any discharge or bleeding.  He does wear supportive undergarments.  He has had this issue in the past.  Changing undergarments seem to help.  When he walks things appear to get worse.  He is requesting to see a urologist.  He has a history of low vitamin D and B12.  Would like this checked today.  He also has a history of left ear pain where he was told he could have low vitamin B2 and possibly low magnesium as well.  He is requesting these are checked today.  Past Medical History:  Diagnosis Date  . Dizziness   . Hypercholesteremia    pt. denies  . Osteochondritis dissecans of left knee   . Thyroid disease   . Wears glasses      Past Surgical History:  Procedure Laterality Date  . APPENDECTOMY  age 5  . CHONDROPLASTY Left 03/06/2015   Procedure: CHONDROPLASTY;  Surgeon: Sydnee Cabal, MD;  Location: [facility];  Service: Orthopedics;  Laterality: Left;  . KNEE ARTHROSCOPY Left 03/06/2015   Procedure: ARTHROSCOPY KNEE DEBRIDEMENT  of OCD LESION  AND HARVEST CARTILAGE;  Surgeon: Sydnee Cabal, MD;  Location: [place];  Service: Orthopedics;  Laterality: Left;    Medications  Current Outpatient Medications on File Prior to Visit  Medication Sig Dispense Refill  . levothyroxine (SYNTHROID) 100 MCG tablet Take 1 tablet (100 mcg total) by mouth daily before breakfast. 30 tablet 3    Allergies No Known Allergies  Family History Family History  Problem Relation Age of Onset  . Hypertension Mother   .  Diabetes Mother   . Hypertension Father   . Diabetes Father   . Colon cancer Neg Hx     Review of Systems: Constitutional: no fevers or chills Eye:  no recent significant change in vision Ear/Nose/Mouth/Throat:  Ears:  no hearing loss Nose/Mouth/Throat:  no complaints of nasal congestion, no sore throat Cardiovascular:  no chest pain Respiratory:  no shortness of breath Gastrointestinal:  no abdominal pain, no change in bowel habits GU:  Male: negative for dysuria, frequency, and incontinence Musculoskeletal/Extremities:  no pain of the joints Integumentary (Skin/Breast):  no abnormal skin lesions reported Neurologic:  no headaches Endocrine: No unexpected weight changes Hematologic/Lymphatic:  no night sweats  Exam BP 108/82 (BP Location: Left Arm, Patient Position: Sitting, Cuff Size: Large)   Pulse 75   Temp (!) 97.5 F (36.4 C) (Temporal)   Ht 5' 9.5" (1.765 m)   Wt 208 lb (94.3 kg)   SpO2 98%   BMI 30.28 kg/m  General:  well developed, well nourished, in no apparent distress Skin:  no significant moles, warts, or growths Head:  no masses, lesions, or tenderness Eyes:  pupils equal and round, sclera anicteric without injection Ears:  canals without lesions, TMs shiny without retraction, no obvious effusion, no erythema Nose:  nares patent, septum midline, mucosa normal Throat/Pharynx:  lips and gingiva without lesion; tongue and uvula midline; non-inflamed pharynx; no exudates or postnasal  drainage Neck: neck supple without adenopathy, thyromegaly, or masses Lungs:  clear to auscultation, breath sounds equal bilaterally, no respiratory distress Cardio:  regular rate and rhythm, no bruits, no LE edema Abdomen:  abdomen soft, nontender; bowel sounds normal; no masses or organomegaly Genital (male): circumcised penis, no lesions or discharge; testes present bilaterally without masses or tenderness Rectal: Deferred Musculoskeletal:  symmetrical muscle groups noted without  atrophy or deformity Extremities:  no clubbing, cyanosis, or edema, no deformities, no skin discoloration Neuro:  gait normal; deep tendon reflexes normal and symmetric Psych: well oriented with normal range of affect and appropriate judgment/insight  Assessment and Plan  Well adult exam - Plan: CBC, Comp Met (CMET), HgB A1c, Lipid Profile  Left testicular pain - Plan: Ambulatory referral to Urology  B12 deficiency - Plan: B12  Vitamin D insufficiency - Plan: Vitamin D (25 hydroxy)  Left ear pain - Plan: Magnesium, Vitamin B2(Riboflavin),Plasma   Well 33 y.o. male. Counseled on diet and exercise. Self testicular exams recommended at least monthly.  Not terribly painful on exam today, still he requested a referral so we will place.  Other orders as above. Patient has his PhD defense in the next month.  After that he is going back to [place].  He is to let us know if anything changes or he needs any refills prior to leaving. The patient voiced understanding and agreement to the plan.  [place], DO 06/27/19 2:23 PM

## 2019-06-27 NOTE — Patient Instructions (Addendum)
Give [place] 2-3 business days to get the results of your labs back.   Good luck in the future!  Keep the diet clean and stay active.  If you do not hear anything about your referral in the next 1-2 weeks, call our office and ask for an update.  Let us know if you need anything.

## 2019-07-01 ENCOUNTER — Other Ambulatory Visit: Payer: Self-pay | Admitting: Family Medicine

## 2019-07-01 MED ORDER — VITAMIN D (ERGOCALCIFEROL) 1.25 MG (50000 UNIT) PO CAPS: ORAL_CAPSULE | ORAL | 0 refills | Status: DC

## 2019-07-02 LAB — VITAMIN B2(RIBOFLAVIN),PLASMA: Vitamin B2(Riboflavin),Plasma: 22.2 nmol/L (ref 6.2–39.0)

## 2019-08-02 ENCOUNTER — Other Ambulatory Visit: Payer: Self-pay | Admitting: Family Medicine

## 2019-08-02 DIAGNOSIS — R1013 Epigastric pain: Secondary | ICD-10-CM

## 2019-09-02 ENCOUNTER — Other Ambulatory Visit: Payer: Self-pay | Admitting: Family Medicine

## 2019-09-27 ENCOUNTER — Other Ambulatory Visit: Payer: Self-pay | Admitting: Family Medicine

## 2019-10-04 ENCOUNTER — Other Ambulatory Visit: Payer: Self-pay | Admitting: Family Medicine

## 2019-10-04 ENCOUNTER — Telehealth: Payer: Self-pay

## 2019-10-04 DIAGNOSIS — E559 Vitamin D deficiency, unspecified: Secondary | ICD-10-CM

## 2019-10-04 NOTE — Telephone Encounter (Signed)
Let's recheck again, please sched Vit D lab visit and order. Ty.

## 2019-10-04 NOTE — Telephone Encounter (Signed)
Last OV--06/27/2019 Last Vitamin D result ---- 06/27/2019 and was 12.54 Last refill on vitamin d 01007 was on 09/27/2019 for #12

## 2019-10-04 NOTE — Telephone Encounter (Signed)
Called back and schedule appt/for tomorrow for problems with his colon/will do vitamin D lab at that time

## 2019-10-04 NOTE — Telephone Encounter (Signed)
Patient called in to see if Dr. Carmelia Roller can give him so advice on his Vitamin D pills.   Please call the patient at [phone]  Thanks,

## 2019-10-05 ENCOUNTER — Other Ambulatory Visit: Payer: PPO

## 2019-10-05 ENCOUNTER — Encounter: Payer: Self-pay | Admitting: Family Medicine

## 2019-10-05 ENCOUNTER — Ambulatory Visit (INDEPENDENT_AMBULATORY_CARE_PROVIDER_SITE_OTHER): Payer: PPO | Admitting: Family Medicine

## 2019-10-05 ENCOUNTER — Other Ambulatory Visit: Payer: Self-pay

## 2019-10-05 VITALS — BP 104/64 | HR 75 | Temp 97.4°F | Ht 69.0 in | Wt 206.0 lb

## 2019-10-05 DIAGNOSIS — E559 Vitamin D deficiency, unspecified: Secondary | ICD-10-CM

## 2019-10-05 DIAGNOSIS — R109 Unspecified abdominal pain: Secondary | ICD-10-CM

## 2019-10-05 LAB — CBC
HCT: 40.1 % (ref 39.0–52.0)
Hemoglobin: 13.6 g/dL (ref 13.0–17.0)
MCHC: 34.1 g/dL (ref 30.0–36.0)
MCV: 88.2 fl (ref 78.0–100.0)
Platelets: 161 10*3/uL (ref 150.0–400.0)
RBC: 4.54 Mil/uL (ref 4.22–5.81)
RDW: 12.7 % (ref 11.5–15.5)
WBC: 4.9 10*3/uL (ref 4.0–10.5)

## 2019-10-05 LAB — VITAMIN D 25 HYDROXY (VIT D DEFICIENCY, FRACTURES): VITD: 16.73 ng/mL — ABNORMAL LOW (ref 30.00–100.00)

## 2019-10-05 LAB — COMPREHENSIVE METABOLIC PANEL
ALT: 46 U/L (ref 0–53)
AST: 20 U/L (ref 0–37)
Albumin: 4.6 g/dL (ref 3.5–5.2)
Alkaline Phosphatase: 52 U/L (ref 39–117)
BUN: 16 mg/dL (ref 6–23)
CO2: 27 mEq/L (ref 19–32)
Calcium: 9.5 mg/dL (ref 8.4–10.5)
Chloride: 105 mEq/L (ref 96–112)
Creatinine, Ser: 0.82 mg/dL (ref 0.40–1.50)
GFR: 107.4 mL/min (ref >60.00)
Glucose, Bld: 94 mg/dL (ref 70–99)
Potassium: 3.8 mEq/L (ref 3.5–5.1)
Sodium: 138 mEq/L (ref 135–145)
Total Bilirubin: 0.7 mg/dL (ref 0.2–1.2)
Total Protein: 7.5 g/dL (ref 6.0–8.3)

## 2019-10-05 NOTE — Patient Instructions (Addendum)
Give [place] 2-3 business days to get the results of your labs back.   Someone will reach out to you regarding the ultrasound.  If it is normal, we will let you know and start a medicine for something called irritable bowel syndrome.   Try MiraLAX 1-2 times daily over the next 3-4 days. If no improvement, try using an enema. Stay well hydrated and keep lots of fiber in your diet.  Let us know if you need anything.   Irritable Bowel Syndrome, Adult  Irritable bowel syndrome (IBS) is a group of symptoms that affects the organs responsible for digestion (gastrointestinal or GI tract). IBS is not one specific disease. To regulate how the GI tract works, the body sends signals back and forth between the intestines and the brain. If you have IBS, there may be a problem with these signals. As a result, the GI tract does not function normally. The intestines may become more sensitive and overreact to certain things. This may be especially true when you eat certain foods or when you are under stress. There are four types of IBS. These may be determined based on the consistency of your stool (feces):  IBS with diarrhea.  IBS with constipation.  Mixed IBS.  Unsubtyped IBS. It is important to know which type of IBS you have. Certain treatments are more likely to be helpful for certain types of IBS. What are the causes? The exact cause of IBS is not known. What increases the risk? You may have a higher risk for IBS if you:  Are male.  Are younger than 50.  Have a family history of IBS.  Have a mental health condition, such as depression, anxiety, or post-traumatic stress disorder.  Have had a bacterial infection of your GI tract. What are the signs or symptoms? Symptoms of IBS vary from person to person. The main symptom is abdominal pain or discomfort. Other symptoms usually include one or more of the following:  Diarrhea, constipation, or both.  Abdominal swelling or  bloating.  Feeling full after eating a small or regular-sized meal.  Frequent gas.  Mucus in the stool.  A feeling of having more stool left after a bowel movement. Symptoms tend to come and go. They may be triggered by stress, mental health conditions, or certain foods. How is this diagnosed? This condition may be diagnosed based on a physical exam, your medical history, and your symptoms. You may have tests, such as:  Blood tests.  Stool test.  X-rays.  CT scan.  Colonoscopy. This is a procedure in which your GI tract is viewed with a long, thin, flexible tube. How is this treated? There is no cure for IBS, but treatment can help relieve symptoms. Treatment depends on the type of IBS you have, and may include:  Changes to your diet, such as: ? Avoiding foods that cause symptoms. ? Drinking more water. ? Following a low-FODMAP (fermentable oligosaccharides, disaccharides, monosaccharides, and polyols) diet for up to 6 weeks, or as told by your health care provider. FODMAPs are sugars that are hard for some people to digest. ? Eating more fiber. ? Eating medium-sized meals at the same times every day.  Medicines. These may include: ? Fiber supplements, if you have constipation. ? Medicine to control diarrhea (antidiarrheal medicines). ? Medicine to help control muscle tightening (spasms) in your GI tract (antispasmodic medicines). ? Medicines to help with mental health conditions, such as antidepressants or tranquilizers.  Talk therapy or counseling.  Working with  a diet and nutrition specialist (dietitian) to help create a food plan that is right for you.  Managing your stress. Follow these instructions at home: Eating and drinking  Eat a healthy diet.  Eat medium-sized meals at about the same time every day. Do not eat large meals.  Gradually eat more fiber-rich foods. These include whole grains, fruits, and vegetables. This may be especially helpful if you have  IBS with constipation.  Eat a diet low in FODMAPs.  Drink enough fluid to keep your urine pale yellow.  Keep a journal of foods that seem to trigger symptoms.  Avoid foods and drinks that: ? Contain added sugar. ? Make your symptoms worse. Dairy products, caffeinated drinks, and carbonated drinks can make symptoms worse for some people. General instructions  Take over-the-counter and prescription medicines and supplements only as told by your health care provider.  Get enough exercise. Do at least 150 minutes of moderate-intensity exercise each week.  Manage your stress. Getting enough sleep and exercise can help you manage stress.  Keep all follow-up visits as told by your health care provider and therapist. This is important. Alcohol Use  Do not drink alcohol if: ? Your health care provider tells you not to drink. ? You are pregnant, may be pregnant, or are planning to become pregnant.  If you drink alcohol, limit how much you have: ? 0-1 drink a day for women. ? 0-2 drinks a day for men.  Be aware of how much alcohol is in your drink. In the [place] one drink equals one typical bottle of beer (12 oz), one-half glass of wine (5 oz), or one shot of hard liquor (1 oz). Contact a health care provider if you have:  Constant pain.  Weight loss.  Difficulty or pain when swallowing.  Diarrhea that gets worse. Get help right away if you have:  Severe abdominal pain.  Fever.  Diarrhea with symptoms of dehydration, such as dizziness or dry mouth.  Bright red blood in your stool.  Stool that is black and tarry.  Abdominal swelling.  Vomiting that does not stop.  Blood in your vomit. Summary  Irritable bowel syndrome (IBS) is not one specific disease. It is a group of symptoms that affects digestion.  Your intestines may become more sensitive and overreact to certain things. This may be especially true when you eat certain foods or when you are under stress.  There  is no cure for IBS, but treatment can help relieve symptoms. This information is not intended to replace advice given to you by your health care provider. Make sure you discuss any questions you have with your health care provider. Document Revised: 07/28/2017 Document Reviewed: 07/28/2017 Elsevier Patient Education  2020 [employer].

## 2019-10-05 NOTE — Progress Notes (Signed)
Chief Complaint  Patient presents with  . Follow-up    problems since having endoscopy    Subjective: Patient is a 34 y.o. male here for abd pain.  Over past 3 d, has had a right sided pressure in the abd region. No triggers or alleviating factors. Seems to get worse with stress for chronic issues. Unsure exactly when asked directly if fatty/greasy foods cause issues. Does not wake him up at night, has not had BM in 2 d. No N/V, fevers, redness, bruising, inj or change in activity. Has extensive GI workup in past. Saw GI, EGD neg in 06/2019. Had [place] in 2019, but this is new.   +Hx of Vit D def. Was called in weekly supp but did not take it. Would prefer something Kosher.   ROS: Const: no fevers GI: As noted in HPI  Past Medical History:  Diagnosis Date  . Dizziness   . Hypercholesteremia    pt. denies  . Osteochondritis dissecans of left knee   . Thyroid disease   . Wears glasses     Objective: BP 104/64 (BP Location: Left Arm, Patient Position: Sitting, Cuff Size: Normal)   Pulse 75   Temp (!) 97.4 F (36.3 C) (Temporal)   Ht 5\' 9"  (1.753 m)   Wt 206 lb (93.4 kg)   SpO2 98%   BMI 30.42 kg/m  General: Awake, appears stated age HEENT: EOMi Heart: RRR, no LE edema Lungs: CTAB, no rales, wheezes or rhonchi. No accessory muscle use Abd: S, BS+, TTP on R side; +Murphy's. Neg McBurney's, Rovsing's, Carnett's Psych: Age appropriate judgment and insight, normal affect and mood  Assessment and Plan: Right sided abdominal pain - Plan: Abdomen Limited RUQ, CBC, Comprehensive metabolic panel  Vitamin D deficiency - Plan: VITAMIN D 25 Hydroxy (Vit-D Deficiency, Fractures)  1- Ck [place] again. If neg, will tx for IBS w TCA. I think he needs to have a BM also.  2- For Vit D, prefers something Kosher. Will ck today. F/u pending above.  The patient voiced understanding and agreement to the plan.  [place], DO 10/05/19  7:26 AM

## 2019-10-07 ENCOUNTER — Encounter: Payer: Self-pay | Admitting: Family Medicine

## 2019-10-07 MED ORDER — CHOLECALCIFEROL 1.25 MG (50000 UT) PO CAPS: 50000.0000 [IU] | ORAL_CAPSULE | ORAL | 0 refills | Status: DC

## 2019-10-11 ENCOUNTER — Other Ambulatory Visit (HOSPITAL_BASED_OUTPATIENT_CLINIC_OR_DEPARTMENT_OTHER): Payer: PPO

## 2019-10-12 ENCOUNTER — Ambulatory Visit (HOSPITAL_BASED_OUTPATIENT_CLINIC_OR_DEPARTMENT_OTHER)
Admission: RE | Admit: 2019-10-12 | Discharge: 2019-10-12 | Disposition: A | Discharge status: Home / Self Care | Payer: PPO | Source: Ambulatory Visit | Attending: Family Medicine | Admitting: Family Medicine

## 2019-10-12 ENCOUNTER — Other Ambulatory Visit: Payer: Self-pay | Admitting: Family Medicine

## 2019-10-12 ENCOUNTER — Other Ambulatory Visit: Payer: Self-pay

## 2019-10-12 DIAGNOSIS — R109 Unspecified abdominal pain: Secondary | ICD-10-CM

## 2019-10-12 MED ORDER — AMITRIPTYLINE HCL 25 MG PO TABS: 25.0000 mg | ORAL_TABLET | Freq: Every day | ORAL | 2 refills | Status: DC

## 2019-11-04 ENCOUNTER — Other Ambulatory Visit: Payer: Self-pay | Admitting: Family Medicine

## 2019-11-04 NOTE — Telephone Encounter (Signed)
Last RF 10/12/2019  #30 with 2 refills Last OV 10/05/2019

## 2019-12-07 ENCOUNTER — Other Ambulatory Visit: Payer: Self-pay

## 2019-12-08 ENCOUNTER — Other Ambulatory Visit: Payer: Self-pay

## 2019-12-08 ENCOUNTER — Ambulatory Visit (INDEPENDENT_AMBULATORY_CARE_PROVIDER_SITE_OTHER): Payer: PPO | Admitting: Internal Medicine

## 2019-12-08 ENCOUNTER — Encounter: Payer: Self-pay | Admitting: Internal Medicine

## 2019-12-08 ENCOUNTER — Ambulatory Visit (HOSPITAL_BASED_OUTPATIENT_CLINIC_OR_DEPARTMENT_OTHER)
Admission: RE | Admit: 2019-12-08 | Discharge: 2019-12-08 | Disposition: A | Discharge status: Home / Self Care | Payer: PPO | Source: Ambulatory Visit | Attending: Internal Medicine | Admitting: Internal Medicine

## 2019-12-08 VITALS — BP 115/71 | HR 65 | Temp 97.8°F | Resp 16 | Ht 69.0 in | Wt 205.2 lb

## 2019-12-08 DIAGNOSIS — R1010 Upper abdominal pain, unspecified: Secondary | ICD-10-CM | POA: Diagnosis not present

## 2019-12-08 DIAGNOSIS — N50819 Testicular pain, unspecified: Secondary | ICD-10-CM | POA: Diagnosis not present

## 2019-12-08 DIAGNOSIS — R1013 Epigastric pain: Secondary | ICD-10-CM

## 2019-12-08 MED ORDER — PANTOPRAZOLE SODIUM 40 MG PO TBEC: 40.0000 mg | DELAYED_RELEASE_TABLET | Freq: Every day | ORAL | 3 refills | Status: AC

## 2019-12-08 NOTE — Progress Notes (Signed)
Subjective:    Patient ID: Timothy Austin, male    DOB: May 02, 1986, 34 y.o.   MRN: [id number]  DOS:  12/08/2019 Type of visit - description: Acute The patient has a history of chronic abdominal pain. He is here because he is having exacerbation of abdominal pain, mostly at the upper abdomen and left upper quadrant. It is associated with heartburn.  He also has some constipation but last bowel movement was yesterday.  He also have pain behind the left testicle, seen by urology last week, prescribed amoxicillin, they did the ultrasound according to the patient.    Review of Systems No fever chills No nausea or vomiting No blood in the stools No weight loss  Past Medical History:  Diagnosis Date  . Dizziness   . Hypercholesteremia    pt. denies  . Osteochondritis dissecans of left knee   . Thyroid disease   . Wears glasses     Past Surgical History:  Procedure Laterality Date  . APPENDECTOMY  age 34  . CHONDROPLASTY Left 03/06/2015   Procedure: CHONDROPLASTY;  Surgeon: Eugenia Mcalpine, MD;  Location: [facility];  Service: Orthopedics;  Laterality: Left;  . KNEE ARTHROSCOPY Left 03/06/2015   Procedure: ARTHROSCOPY KNEE DEBRIDEMENT  of OCD LESION  AND HARVEST CARTILAGE;  Surgeon: Eugenia Mcalpine, MD;  Location: [facility];  Service: Orthopedics;  Laterality: Left;    Allergies as of 12/08/2019   No Known Allergies     Medication List       Accurate as of December 08, 2019  3:32 PM. If you have any questions, ask your nurse or doctor.        amitriptyline 25 MG tablet Commonly known as: ELAVIL TAKE 1 TABLET BY MOUTH EVERYDAY AT BEDTIME   Cholecalciferol 1.25 MG (50000 UT) capsule Take 1 capsule (50,000 Units total) by mouth once a week.   levothyroxine 100 MCG tablet Commonly known as: SYNTHROID TAKE 1 TABLET (100 MCG TOTAL) BY MOUTH DAILY BEFORE BREAKFAST.   pantoprazole 40 MG tablet Commonly known as: PROTONIX TAKE 1 TABLET BY  MOUTH EVERY DAY          Objective:   Physical Exam BP 115/71 (BP Location: Left Arm, Patient Position: Sitting, Cuff Size: Normal)   Pulse 65   Temp 97.8 F (36.6 C) (Temporal)   Resp 16   Ht 5\' 9"  (1.753 m)   Wt 205 lb 4 oz (93.1 kg)   SpO2 100%   BMI 30.31 kg/m  General:   Well developed, NAD, BMI noted.  HEENT:  Normocephalic . Face symmetric, atraumatic.  Not pale or jaundiced. Lungs:  CTA B Normal respiratory effort, no intercostal retractions, no accessory muscle use. Heart: RRR,  no murmur.  Abdomen:  Not distended, soft, slightly TTP without mass or rebound mostly in the epigastric area, some at the left upper quadrant.  + Bowel sounds. No evidence of inguinal hernias at this point (patient asked to be checked for hernias) GU: Penis normal Scrotal contents: Testicles symmetric, not tender, epididymal tissue seems normal. Skin: Not pale. Not jaundice Lower extremities: no pretibial edema bilaterally  Neurologic:  alert & oriented X3.  Speech normal, gait appropriate for age and unassisted Psych--  Cognition and judgment appear intact.  Cooperative with normal attention span and concentration.  Behavior appropriate. No anxious or depressed appearing.     Assessment     34 year old gentleman, PMH includes thyroid disease, chronic abdominal pain w/ previous work-up included an abdominal ultrasound,  CT abdomen  with IV and oral contrast 2019: Normal except for small left inguinal hernia, EGD 06-2019, gastritis, biopsy showing nonspecific pathologic changes, negative H. pylori. Also, previous  labs included CBCs LFTs, B12 ;levels>> wnl Presents with:  Acute on chronic abdominal pain: As described above, he is tender at the epigastric area upon palpation. No recent NSAID intake.  Has not been taking PPIs. Since he reports severe pain for the last 3 days we will check a CBC, LFTs and plain abdominal x-rays. Restart Protonix Continue avoiding NSAIDs Call if  not gradually better Testicular discomfort: Already seen by urology few days ago according to the patient, on amoxicillin for it.  Exam today is benign. Constipation: Has on and off issues with constipation, recommend to discuss with PCP. RTC 2 weeks with PCP, see AVS.     This visit occurred during the SARS-CoV-2 public health emergency.  Safety protocols were in place, including screening questions prior to the visit, additional usage of staff PPE, and extensive cleaning of exam room while observing appropriate contact time as indicated for disinfecting solutions.

## 2019-12-08 NOTE — Progress Notes (Signed)
Pre visit review using our clinic review tool, if applicable. No additional management support is needed unless otherwise documented below in the visit note. 

## 2019-12-08 NOTE — Patient Instructions (Signed)
Go back on pantoprazole 40 mg 1 before your breakfast  Avoid any type of ibuprofen, naproxen or anti-inflammatories  If you have severe stomach pain, you are not gradually better in the next few days, you have fever, chills or blood in the stools: Call or go to the ER   GO TO THE LAB : Get the blood work     GO TO THE FRONT DESK, PLEASE SCHEDULE YOUR APPOINTMENTS Come back for    checkup with Dr. Carmelia Roller in 2 weeks  STOP BY THE FIRST FLOOR:  get the XR

## 2019-12-09 ENCOUNTER — Encounter (HOSPITAL_COMMUNITY): Payer: Self-pay

## 2019-12-09 ENCOUNTER — Emergency Department (HOSPITAL_COMMUNITY)
Admission: EM | Admit: 2019-12-09 | Discharge: 2019-12-09 | Disposition: A | Discharge status: Home / Self Care | Payer: PPO | Attending: Emergency Medicine | Admitting: Emergency Medicine

## 2019-12-09 ENCOUNTER — Emergency Department (HOSPITAL_COMMUNITY): Payer: PPO

## 2019-12-09 ENCOUNTER — Other Ambulatory Visit: Payer: Self-pay

## 2019-12-09 DIAGNOSIS — Z79899 Other long term (current) drug therapy: Secondary | ICD-10-CM | POA: Diagnosis not present

## 2019-12-09 DIAGNOSIS — E039 Hypothyroidism, unspecified: Secondary | ICD-10-CM | POA: Insufficient documentation

## 2019-12-09 DIAGNOSIS — K5900 Constipation, unspecified: Secondary | ICD-10-CM | POA: Diagnosis not present

## 2019-12-09 DIAGNOSIS — R140 Abdominal distension (gaseous): Secondary | ICD-10-CM | POA: Diagnosis not present

## 2019-12-09 DIAGNOSIS — R0602 Shortness of breath: Secondary | ICD-10-CM | POA: Diagnosis not present

## 2019-12-09 DIAGNOSIS — R0789 Other chest pain: Secondary | ICD-10-CM | POA: Diagnosis not present

## 2019-12-09 LAB — CBC
HCT: 42.2 % (ref 39.0–52.0)
Hemoglobin: 14 g/dL (ref 13.0–17.0)
MCH: 29.9 pg (ref 26.0–34.0)
MCHC: 33.2 g/dL (ref 30.0–36.0)
MCV: 90.2 fL (ref 80.0–100.0)
Platelets: 184 10*3/uL (ref 150–400)
RBC: 4.68 MIL/uL (ref 4.22–5.81)
RDW: 12.4 % (ref 11.5–15.5)
WBC: 5 10*3/uL (ref 4.0–10.5)
nRBC: 0 % (ref 0.0–0.2)

## 2019-12-09 LAB — HEPATIC FUNCTION PANEL
ALT: 42 U/L (ref 0–53)
ALT: 45 U/L — ABNORMAL HIGH (ref 0–44)
AST: 23 U/L (ref 0–37)
AST: 27 U/L (ref 15–41)
Albumin: 4.7 g/dL (ref 3.5–5.2)
Albumin: 4.5 g/dL (ref 3.5–5.0)
Alkaline Phosphatase: 55 U/L (ref 39–117)
Alkaline Phosphatase: 49 U/L (ref 38–126)
Bilirubin, Direct: 0.1 mg/dL (ref 0.0–0.3)
Bilirubin, Direct: 0.1 mg/dL (ref 0.0–0.2)
Indirect Bilirubin: 0.7 mg/dL (ref 0.3–0.9)
Total Bilirubin: 0.6 mg/dL (ref 0.2–1.2)
Total Bilirubin: 0.8 mg/dL (ref 0.3–1.2)
Total Protein: 7.5 g/dL (ref 6.0–8.3)
Total Protein: 7.9 g/dL (ref 6.5–8.1)

## 2019-12-09 LAB — CBC WITH DIFFERENTIAL/PLATELET
Basophils Absolute: 0 10*3/uL (ref 0.0–0.1)
Basophils Relative: 1 % (ref 0.0–3.0)
Eosinophils Absolute: 0.1 10*3/uL (ref 0.0–0.7)
Eosinophils Relative: 2.8 % (ref 0.0–5.0)
HCT: 38.5 % — ABNORMAL LOW (ref 39.0–52.0)
Hemoglobin: 12.9 g/dL — ABNORMAL LOW (ref 13.0–17.0)
Lymphocytes Relative: 46.4 % — ABNORMAL HIGH (ref 12.0–46.0)
Lymphs Abs: 2.2 10*3/uL (ref 0.7–4.0)
MCHC: 33.5 g/dL (ref 30.0–36.0)
MCV: 88.2 fl (ref 78.0–100.0)
Monocytes Absolute: 0.3 10*3/uL (ref 0.1–1.0)
Monocytes Relative: 6.8 % (ref 3.0–12.0)
Neutro Abs: 2 10*3/uL (ref 1.4–7.7)
Neutrophils Relative %: 43 % (ref 43.0–77.0)
Platelets: 182 10*3/uL (ref 150.0–400.0)
RBC: 4.37 Mil/uL (ref 4.22–5.81)
RDW: 13 % (ref 11.5–15.5)
WBC: 4.8 10*3/uL (ref 4.0–10.5)

## 2019-12-09 LAB — URINALYSIS, ROUTINE W REFLEX MICROSCOPIC
Bilirubin Urine: NEGATIVE
Glucose, UA: NEGATIVE mg/dL
Hgb urine dipstick: NEGATIVE
Ketones, ur: NEGATIVE mg/dL
Leukocytes,Ua: NEGATIVE
Nitrite: NEGATIVE
Protein, ur: NEGATIVE mg/dL
Specific Gravity, Urine: 1.019 (ref 1.005–1.030)
pH: 6 (ref 5.0–8.0)

## 2019-12-09 LAB — BASIC METABOLIC PANEL
Anion gap: 7 (ref 5–15)
BUN: 12 mg/dL (ref 6–20)
CO2: 28 mmol/L (ref 22–32)
Calcium: 9.5 mg/dL (ref 8.9–10.3)
Chloride: 104 mmol/L (ref 98–111)
Creatinine, Ser: 0.82 mg/dL (ref 0.61–1.24)
GFR calc Af Amer: >60 mL/min (ref >60)
GFR calc non Af Amer: >60 mL/min (ref >60)
Glucose, Bld: 88 mg/dL (ref 70–99)
Potassium: 4 mmol/L (ref 3.5–5.1)
Sodium: 139 mmol/L (ref 135–145)

## 2019-12-09 LAB — TROPONIN I (HIGH SENSITIVITY): Troponin I (High Sensitivity): 3 ng/L (ref <18)

## 2019-12-09 LAB — LIPASE, BLOOD: Lipase: 25 U/L (ref 11–51)

## 2019-12-09 MED ORDER — SODIUM CHLORIDE 0.9% FLUSH: 3.0000 mL | Freq: Once | INTRAVENOUS | Status: DC

## 2019-12-09 MED ORDER — SIMETHICONE 80 MG PO TABS: 80.0000 mg | ORAL_TABLET | Freq: Four times a day (QID) | ORAL | 0 refills | Status: AC

## 2019-12-09 MED ORDER — DOCUSATE SODIUM 100 MG PO CAPS: 100.0000 mg | ORAL_CAPSULE | Freq: Two times a day (BID) | ORAL | 0 refills | Status: AC

## 2019-12-09 NOTE — Discharge Instructions (Signed)
Starting tomorrow, take 6 capfuls of MiraLAX in a 32 oz bottle of Gatorade over 2-4 hour period.  On day 2, take 3 capfuls, three times a day.  On day 3, take 1 capful 3 times a day. Slowly cut back as needed until you have normal bowel movements.  Start Colace (stool softener) to make it easier to have a bowel movement Take Simethicone for gas pains Continue Protonix Please make a follow up appointment with Gastroenterology (Dr. Christella Hartigan)

## 2019-12-09 NOTE — ED Provider Notes (Signed)
[facility] DEPT Provider Note   CSN: [id number] Arrival date & time: 12/09/19  1643   History No chief complaint on file.   Timothy Austin is a 34 y.o. male with chronic abdominal pain presents with abdominal bloating, constipation, chest pain and shortness of breath.  Patient states for the past 2 days he has had problems with generalized abdominal pain which is sharp and stabbing in nature, bloating, early satiety.  Last night when he went to go to sleep and was laying flat he was feeling short of breath.  When he sat up the shortness of breath was better.  He is also getting some sharp left-sided chest pain which concerned him.  Right now the symptoms are better but he is concerned about something with his heart or lungs.  He is previously followed by GI for epigastric pain and had an EGD which showed gastritis.  Yesterday he saw his primary care doctor and had acute abdomen and chest x-ray was prescribed Protonix.  He is also tried MiraLAX but states that he still has not had a bowel movement.  Last bowel movement was 2 or 3 days ago.  He has been fasting for religious purposes for the past 10 days.  He denies fever, chills, nausea, vomiting, urinary symptoms.  He has had a left inguinal hernia repair but otherwise no abdominal surgeries.  HPI   Past Medical History:  Diagnosis Date  . Dizziness   . Hypercholesteremia    pt. denies  . Osteochondritis dissecans of left knee   . Thyroid disease   . Wears glasses     Patient Active Problem List   Diagnosis Date Noted  . Epigastric abdominal pain 10/13/2018  . ETD (Eustachian tube dysfunction), left 10/13/2018  . Thoracic back pain 06/05/2018  . Costochondral chest pain 05/20/2018  . Dyslipidemia 05/20/2018  . Hypothyroidism 11/18/2017  . Wellness examination 09/21/2015  . S/P left knee arthroscopy 03/06/2015    Past Surgical History:  Procedure Laterality Date  . APPENDECTOMY  age 23  .  CHONDROPLASTY Left 03/06/2015   Procedure: CHONDROPLASTY;  Surgeon: Sydnee Cabal, MD;  Location: [facility]  Service: Orthopedics;  Laterality: Left;  . KNEE ARTHROSCOPY Left 03/06/2015   Procedure: ARTHROSCOPY KNEE DEBRIDEMENT  of OCD LESION  AND HARVEST CARTILAGE;  Surgeon: Sydnee Cabal, MD;  Location: [facility];  Service: Orthopedics;  Laterality: Left;       Family History  Problem Relation Age of Onset  . Hypertension Mother   . Diabetes Mother   . Hypertension Father   . Diabetes Father   . Colon cancer Neg Hx     Social History   Tobacco Use  . Smoking status: Never Smoker  . Smokeless tobacco: Never Used  Substance Use Topics  . Alcohol use: No  . Drug use: No    Home Medications Prior to Admission medications   Medication Sig Start Date End Date Taking? Authorizing Provider  levothyroxine (SYNTHROID) 100 MCG tablet TAKE 1 TABLET (100 MCG TOTAL) BY MOUTH DAILY BEFORE BREAKFAST. 09/02/19  Yes Shelda Pal, DO  Cholecalciferol 1.25 MG (50000 UT) capsule Take 1 capsule (50,000 Units total) by mouth once a week. Patient not taking: Reported on 12/08/2019 10/07/19   Shelda Pal, DO  pantoprazole (PROTONIX) 40 MG tablet Take 1 tablet (40 mg total) by mouth daily. 12/08/19   Colon Branch, MD    Allergies    Patient has no known allergies.  Review of Systems   Review of Systems  Constitutional: Negative for chills and fever.  Respiratory: Positive for shortness of breath.   Cardiovascular: Positive for chest pain.  Gastrointestinal: Positive for abdominal pain and constipation. Negative for diarrhea, nausea and vomiting.  Genitourinary: Negative for dysuria and flank pain.  All other systems reviewed and are negative.   Physical Exam Updated Vital Signs BP 124/83   Pulse 86   Temp 98.7 F (37.1 C) (Oral)   Resp 18   Ht 5\' 9"  (1.753 m)   Wt 93.1 kg   SpO2 96%   BMI 30.31 kg/m   Physical Exam Vitals and  nursing note reviewed.  Constitutional:      General: He is not in acute distress.    Appearance: Normal appearance. He is well-developed. He is not ill-appearing.  HENT:     Head: Normocephalic and atraumatic.  Eyes:     General: No scleral icterus.       Right eye: No discharge.        Left eye: No discharge.     Conjunctiva/sclera: Conjunctivae normal.     Pupils: Pupils are equal, round, and reactive to light.  Cardiovascular:     Rate and Rhythm: Normal rate and regular rhythm.  Pulmonary:     Effort: Pulmonary effort is normal. No respiratory distress.     Breath sounds: Normal breath sounds.  Abdominal:     General: There is no distension.     Palpations: Abdomen is soft.     Tenderness: There is no abdominal tenderness.  Musculoskeletal:     Cervical back: Normal range of motion.  Skin:    General: Skin is warm and dry.  Neurological:     Mental Status: He is alert and oriented to person, place, and time.  Psychiatric:        Behavior: Behavior normal.     ED Results / Procedures / Treatments   Labs (all labs ordered are listed, but only abnormal results are displayed) Labs Reviewed  CBC  URINALYSIS, ROUTINE W REFLEX MICROSCOPIC  BASIC METABOLIC PANEL  TROPONIN I (HIGH SENSITIVITY)  TROPONIN I (HIGH SENSITIVITY)    EKG EKG Interpretation  Date/Time:  Friday December 09 2019 21:34:56 EDT Ventricular Rate:  66 PR Interval:    QRS Duration: 93 QT Interval:  400 QTC Calculation: 420 R Axis:   29 Text Interpretation: Sinus rhythm ST elev, probable normal early repol pattern No acute changes No significant change since last tracing Confirmed by 02-02-2006 [phone]) on 12/09/2019 9:37:40 PM   Radiology DG Chest 2 View  Result Date: 12/09/2019 CLINICAL DATA:  Chest pain and shortness of breath. EXAM: CHEST - 2 VIEW COMPARISON:  None. FINDINGS: The heart size and mediastinal contours are within normal limits. Both lungs are clear. The visualized skeletal  structures are unremarkable. IMPRESSION: No active cardiopulmonary disease. Electronically Signed   By: 12/11/2019 III M.D   On: 12/09/2019 18:29   DG Abd Acute W/Chest  Result Date: 12/09/2019 CLINICAL DATA:  34 year old presenting with acute onset of epigastric abdominal pain that began 4 days ago. EXAM: DG ABDOMEN ACUTE W/ 1V CHEST COMPARISON:  CT abdomen and pelvis 07/05/2018. Acute abdomen series 07/03/2018. FINDINGS: Bowel gas pattern unremarkable without evidence of obstruction or significant ileus. No evidence of free air or significant air-fluid levels on the erect image. Expected stool burden in the colon. Surgical suture material overlying the low pelvis. No visible opaque urinary tract calculi. Regional skeleton unremarkable.  Cardiomediastinal silhouette unremarkable and unchanged. Lungs clear. Bronchovascular markings normal. Pulmonary vascularity normal. No visible pleural effusions. No pneumothorax. IMPRESSION: 1. No acute abdominal abnormality. 2. No acute cardiopulmonary disease. Electronically Signed   By: Hulan Saas M.D.   On: 12/09/2019 10:29    Procedures Procedures (including critical care time)  Medications Ordered in ED Medications  sodium chloride flush (NS) 0.9 % injection 3 mL (has no administration in time range)    ED Course  I have reviewed the triage vital signs and the nursing notes.  Pertinent labs & imaging results that were available during my care of the patient were reviewed by me and considered in my medical decision making (see chart for details).  34 year old male presents with abdominal bloating, early satiety, constipation.  This is a chronic issue for the patient but been worse in the past 2 days.  It is now causing shortness of breath and chest pain when he lies flat.  EKG is sinus rhythm.  On exam he is alert oriented.  Heart is regular rate and rhythm.  Lungs are clear to auscultation.  Abdomen is soft and nontender.  Chest x-ray obtained  is negative.  Labs are overall reassuring.  Initial troponin is 3.  Do not think we need to repeat since pain is atypical and likely related to his chronic GI issues.  UA is normal.  Low suspicion for any acute intra-abdominal process and do not feel CT imaging is indicated today.  I think his symptoms are all due to constipation.  We will increase the dose of the patient's MiraLAX and add Colace and simethicone.  He was advised to continue Protonix. He has seen GI in the past and was encouraged to follow-up with them for ongoing issues.  MDM Rules/Calculators/A&P                       Final Clinical Impression(s) / ED Diagnoses Final diagnoses:  Abdominal bloating  Constipation, unspecified constipation type  Shortness of breath  Atypical chest pain    Rx / DC Orders ED Discharge Orders    None       Bethel Born, PA-C 12/09/19 2334    Derwood Kaplan, MD 12/11/19 1742

## 2019-12-09 NOTE — ED Triage Notes (Signed)
Patient states he went to his PCP yesterday and was given omeprazole. Today, the patient states symptoms are worse and continues to have chest pain and SOB .  Patient states he has been fasting and when he does eat the pain is worse. Patient states he has issues with constipation and last BM was 2 days ago.

## 2019-12-23 ENCOUNTER — Other Ambulatory Visit: Payer: Self-pay | Admitting: Family Medicine

## 2019-12-28 ENCOUNTER — Ambulatory Visit: Payer: PPO | Attending: Internal Medicine

## 2019-12-28 DIAGNOSIS — Z23 Encounter for immunization: Secondary | ICD-10-CM

## 2019-12-28 NOTE — Progress Notes (Signed)
   Covid-19 Vaccination Clinic  Name:  Timothy Austin    MRN: [id number] DOB: October 06, 1985  12/28/2019  [name] was observed post Covid-19 immunization for 15 minutes without incident. He was provided with Vaccine Information Sheet and instruction to access the V-Safe system.   [name] was instructed to call 911 with any severe reactions post vaccine: Marland Kitchen Difficulty breathing  . Swelling of face and throat  . A fast heartbeat  . A bad rash all over body  . Dizziness and weakness   Immunizations Administered    Name Date Dose VIS Date Route   Pfizer COVID-19 Vaccine 12/28/2019  4:14 PM 0.3 mL 10/12/2018 Intramuscular   Manufacturer: [employer], [employer]   Lot: [id number]   NDC: [address]-0

## 2020-01-02 ENCOUNTER — Ambulatory Visit: Payer: PPO | Admitting: Internal Medicine

## 2020-01-02 DIAGNOSIS — Z0289 Encounter for other administrative examinations: Secondary | ICD-10-CM

## 2020-01-23 ENCOUNTER — Ambulatory Visit: Payer: PPO | Attending: Internal Medicine

## 2020-01-23 DIAGNOSIS — Z23 Encounter for immunization: Secondary | ICD-10-CM

## 2020-01-23 NOTE — Progress Notes (Signed)
   Covid-19 Vaccination Clinic  Name:  Bacilio Abascal    MRN: [id number] DOB: [birth year]  01/23/2020  Mr. Senseney was observed post Covid-19 immunization for 15 minutes without incident. He was provided with Vaccine Information Sheet and instruction to access the V-Safe system.   Mr. Demore was instructed to call 911 with any severe reactions post vaccine: Marland Kitchen Difficulty breathing  . Swelling of face and throat  . A fast heartbeat  . A bad rash all over body  . Dizziness and weakness   Immunizations Administered    Name Date Dose VIS Date Route   Pfizer COVID-19 Vaccine 01/23/2020 10:44 AM 0.3 mL 10/12/2018 Intramuscular   Manufacturer: [employer], [employer]   Lot: [id number]   NDC: [address]-8

## 2020-02-01 ENCOUNTER — Telehealth: Payer: Self-pay

## 2020-02-03 ENCOUNTER — Ambulatory Visit: Payer: PPO | Admitting: Family Medicine

## 2020-02-07 ENCOUNTER — Encounter: Payer: Self-pay | Admitting: Family Medicine

## 2020-02-07 ENCOUNTER — Ambulatory Visit (HOSPITAL_BASED_OUTPATIENT_CLINIC_OR_DEPARTMENT_OTHER)
Admission: RE | Admit: 2020-02-07 | Discharge: 2020-02-07 | Disposition: A | Discharge status: Home / Self Care | Payer: PPO | Source: Ambulatory Visit | Attending: Family Medicine | Admitting: Family Medicine

## 2020-02-07 ENCOUNTER — Other Ambulatory Visit: Payer: Self-pay

## 2020-02-07 ENCOUNTER — Ambulatory Visit (INDEPENDENT_AMBULATORY_CARE_PROVIDER_SITE_OTHER): Payer: PPO | Admitting: Family Medicine

## 2020-02-07 VITALS — BP 106/66 | HR 69 | Temp 98.4°F | Resp 12 | Ht 69.0 in | Wt 201.8 lb

## 2020-02-07 DIAGNOSIS — H9313 Tinnitus, bilateral: Secondary | ICD-10-CM | POA: Diagnosis not present

## 2020-02-07 DIAGNOSIS — E785 Hyperlipidemia, unspecified: Secondary | ICD-10-CM | POA: Diagnosis not present

## 2020-02-07 DIAGNOSIS — R1013 Epigastric pain: Secondary | ICD-10-CM | POA: Diagnosis not present

## 2020-02-07 DIAGNOSIS — R0600 Dyspnea, unspecified: Secondary | ICD-10-CM | POA: Diagnosis present

## 2020-02-07 LAB — LIPID PANEL
Cholesterol: 183 mg/dL (ref 0–200)
HDL: 35.6 mg/dL — ABNORMAL LOW (ref >39.00)
LDL Cholesterol: 138 mg/dL — ABNORMAL HIGH (ref 0–99)
NonHDL: 147.3
Total CHOL/HDL Ratio: 5
Triglycerides: 49 mg/dL (ref 0.0–149.0)
VLDL: 9.8 mg/dL (ref 0.0–40.0)

## 2020-02-07 LAB — MAGNESIUM: Magnesium: 2.1 mg/dL (ref 1.5–2.5)

## 2020-02-07 LAB — COMPREHENSIVE METABOLIC PANEL
ALT: 25 U/L (ref 0–53)
AST: 18 U/L (ref 0–37)
Albumin: 4.7 g/dL (ref 3.5–5.2)
Alkaline Phosphatase: 61 U/L (ref 39–117)
BUN: 10 mg/dL (ref 6–23)
CO2: 30 mEq/L (ref 19–32)
Calcium: 9.9 mg/dL (ref 8.4–10.5)
Chloride: 105 mEq/L (ref 96–112)
Creatinine, Ser: 0.8 mg/dL (ref 0.40–1.50)
GFR: 110.28 mL/min (ref >60.00)
Glucose, Bld: 86 mg/dL (ref 70–99)
Potassium: 4.3 mEq/L (ref 3.5–5.1)
Sodium: 140 mEq/L (ref 135–145)
Total Bilirubin: 0.8 mg/dL (ref 0.2–1.2)
Total Protein: 7.5 g/dL (ref 6.0–8.3)

## 2020-02-07 LAB — CBC
HCT: 39.7 % (ref 39.0–52.0)
Hemoglobin: 13.2 g/dL (ref 13.0–17.0)
MCHC: 33.2 g/dL (ref 30.0–36.0)
MCV: 89.1 fl (ref 78.0–100.0)
Platelets: 193 10*3/uL (ref 150.0–400.0)
RBC: 4.46 Mil/uL (ref 4.22–5.81)
RDW: 13.5 % (ref 11.5–15.5)
WBC: 5.2 10*3/uL (ref 4.0–10.5)

## 2020-02-07 LAB — VITAMIN D 25 HYDROXY (VIT D DEFICIENCY, FRACTURES): VITD: 42.5 ng/mL (ref 30.00–100.00)

## 2020-02-07 NOTE — Progress Notes (Signed)
Chief Complaint  Patient presents with  . Shortness of Breath  . Abdominal Pain    epigastric  . Tinnitus    Subjective: Patient is a 34 y.o. male here for f/u.  Patient has a 10-day history of shortness of breath.  He is not having any other symptoms such as fevers, myalgias, wheezing, coughing, runny/stuffy nose, sore throat, or ear pain/drainage from the ears.  He has no personal or family history of asthma.  He does not notice any worsening with exertion.  He is not having any chest pain/tightness.  He is not a smoker.  He has not tried anything at home.  No unexplained weight changes.  The patient has a history of chronic epigastric abdominal pain.  Protonix helps with the burning but he is try to myriad of other therapies for intermittent flaring that has not been helpful.  He has had a full work-up with the gastroenterology team.  He is not having bleeding, weight changes, nighttime awakenings, or fevers.  He does notice that he has been more distended and gassy than usual.  He is try simethicone and Colace with some relief.  He does not feel like it is working any further.   The patient has a several year history of bilateral tinnitus.  He used to be intermittent but now appears to be more constant.  His hearing is fine otherwise.  No injury to his head or loud noise exposure.  He does not take any ototoxic medications routinely.  Past Medical History:  Diagnosis Date  . Dizziness   . Hypercholesteremia    pt. denies  . Osteochondritis dissecans of left knee   . Thyroid disease   . Wears glasses     Objective: BP 106/66 (BP Location: Left Arm, Cuff Size: Normal)   Pulse 69   Temp 98.4 F (36.9 C) (Temporal)   Resp 12   Ht _0  (1.753 m)   Wt 201 lb 12.8 oz (91.5 kg)   SpO2 98%   BMI 29.80 kg/m  General: Awake, appears stated age HEENT: ears neg b/l Heart: RRR, no bruits Lungs: CTAB, no rales, wheezes or rhonchi. No accessory muscle use Abd: BS+, S, ttp in epigastric  region, ND Psych: Age appropriate judgment and insight, normal affect and mood  Assessment and Plan: Dyspnea, unspecified type - Plan: DG Chest 2 View, CBC, Comprehensive metabolic panel, Magnesium, VITAMIN D 25 Hydroxy (Vit-D Deficiency, Fractures)  Tinnitus of both ears - Plan: Ambulatory referral to ENT  Epigastric abdominal pain  Dyslipidemia - Plan: Lipid panel  1-check above.  Will trial short acting beta agonist this inhaled as needed if above studies are unremarkable. 2-exam is unremarkable, will refer to ENT for their opinion but I did tell the patient there might not be a good treatment for this. 3-continue Protonix.  Try Beano for gas.  A list of gas producing foods was listed in his paperwork. The patient voiced understanding and agreement to the plan.  [place], DO 02/07/20  9:54 AM

## 2020-02-07 NOTE — Patient Instructions (Addendum)
If you do not hear anything about your referral in the next 1-2 weeks, call our office and ask for an update.  Stop chewing gum, drinking carbonated beverages, gulping liquids, and drinking alcohol to help with belching.  These foods may cause you to belch more: Wheat, barley, rye, onion, leek, white part of spring onion, garlic, shallots, artichokes, beetroot, fennel, peas, chicory, pistachio, cashews, legumes, lentils, and chickpeas; Milk, custard, ice cream, and yogurt; Apples, pears, mangoes, cherries, watermelon, asparagus, sugar snap peas, honey, high-fructose corn syrup; Apricots, nectarines, peaches, plums, mushrooms, cauliflower, artificially sweetened chewing gum and confectionery  Try Bean-O for gas instead of simethicone.   If breathing gets worse, go to ER.  We will be in touch regarding your labs and Xray.  Let us know if you need anything.

## 2020-02-08 ENCOUNTER — Other Ambulatory Visit: Payer: Self-pay | Admitting: Family Medicine

## 2020-02-08 MED ORDER — ALBUTEROL SULFATE HFA 108 (90 BASE) MCG/ACT IN AERS
2.0000 | INHALATION_SPRAY | Freq: Four times a day (QID) | RESPIRATORY_TRACT | 2 refills | Status: AC | PRN
Start: 2020-02-08 — End: ?

## 2020-02-10 ENCOUNTER — Other Ambulatory Visit: Payer: Self-pay

## 2020-02-10 DIAGNOSIS — R0600 Dyspnea, unspecified: Secondary | ICD-10-CM

## 2020-02-13 NOTE — Progress Notes (Signed)
Cardiology Office Note:   Date:  02/15/2020  NAME:  Timothy Austin    MRN: [id number] DOB:  [birth year]   PCP:  Sharlene Dory, DO  Cardiologist:  No primary care provider on file.  Electrophysiologist:  None   Referring MD: Sharlene Dory*   Chief Complaint  Patient presents with  . Shortness of Breath   History of Present Illness:   Timothy Austin is a 34 y.o. male with a hx of HLD who is being seen today for the evaluation of shortness of breath at the request of Sharlene Dory, DO. Recent evaluation by PCP. CXR normal.   He reports he has had intermittent episodes of shortness of breath at night for years.  He reports he has had a sensation where he is not getting enough air in mainly at night.  Apparently over the last 2 weeks this is occurring daily.  He feels that he cannot get air in.  Despite this he is exercising twice per day.  He runs or walks 30 to 45 minutes twice per day.  He has no limitations such as shortness of breath or chest pain when he does exercises much.  He does report he has gas and takes Beano over-the-counter.  He also has acid reflux and takes Protonix.  He does describe a cough with a sensation of shortness of breath.  He denies any chest pain or pressure.  He just reports he feels a bit tight in his chest.  This mainly occurs when he is short of breath.  His EKG today demonstrates normal sinus rhythm with no acute ST-T changes or evidence of prior infarction.  His cardiovascular exam has no evidence of volume overload and there is no evidence of congestive heart failure that I can tell.  He is a never smoker.  Does not drink alcohol.  He reports no new medications other than Beano over-the-counter.  He does have hypothyroidism and his most recent TSH from what I can tell was 12.3 in September 2020.  This has not been rechecked from what I can tell.  He does need this updated today.  He also has plans to see ENT and he may end up getting  PFTs.  Overall he seems to be in pretty good health and good cardiovascular health.  T chol 183, HDL 36, LDL 138, TG 49  Past Medical History: Past Medical History:  Diagnosis Date  . Dizziness   . Hypercholesteremia    pt. denies  . Osteochondritis dissecans of left knee   . Thyroid disease   . Wears glasses     Past Surgical History: Past Surgical History:  Procedure Laterality Date  . APPENDECTOMY  age 73  . CHONDROPLASTY Left 03/06/2015   Procedure: CHONDROPLASTY;  Surgeon: Eugenia Mcalpine, MD;  Location: [facility];  Service: Orthopedics;  Laterality: Left;  . KNEE ARTHROSCOPY Left 03/06/2015   Procedure: ARTHROSCOPY KNEE DEBRIDEMENT  of OCD LESION  AND HARVEST CARTILAGE;  Surgeon: Eugenia Mcalpine, MD;  Location: [facility];  Service: Orthopedics;  Laterality: Left;    Current Medications: Current Meds  Medication Sig  . albuterol (VENTOLIN HFA) 108 (90 Base) MCG/ACT inhaler Inhale 2 puffs into the lungs every 6 (six) hours as needed for wheezing or shortness of breath.  . Cholecalciferol (VITAMIN D3) 1.25 MG (50000 UT) CAPS TAKE 1 CAPSULE BY MOUTH ONE TIME PER WEEK  . docusate sodium (COLACE) 100 MG capsule Take 1 capsule (100 mg total) by  mouth every 12 (twelve) hours.  Marland Kitchen levothyroxine (SYNTHROID) 100 MCG tablet TAKE 1 TABLET (100 MCG TOTAL) BY MOUTH DAILY BEFORE BREAKFAST.  . pantoprazole (PROTONIX) 40 MG tablet Take 1 tablet (40 mg total) by mouth daily. (Patient taking differently: Take 40 mg by mouth daily as needed. )  . Simethicone 80 MG TABS Take 1 tablet (80 mg total) by mouth in the morning, at noon, in the evening, and at bedtime.  Marland Kitchen tiZANidine (ZANAFLEX) 2 MG tablet Take 2 mg by mouth every 8 (eight) hours as needed.     Allergies:    Patient has no known allergies.   Social History: Social History   Socioeconomic History  . Marital status: Married    Spouse name: Not on file  . Number of children: 2  . Years of education:  Not on file  . Highest education level: Not on file  Occupational History  . Occupation: Ship broker  Tobacco Use  . Smoking status: Never Smoker  . Smokeless tobacco: Never Used  Vaping Use  . Vaping Use: Never used  Substance and Sexual Activity  . Alcohol use: No  . Drug use: No  . Sexual activity: Not on file  Other Topics Concern  . Not on file  Social History Narrative  . Not on file   Social Determinants of Health   Financial Resource Strain:   . Difficulty of Paying Living Expenses:   Food Insecurity:   . Worried About Charity fundraiser in the Last Year:   . Arboriculturist in the Last Year:   Transportation Needs:   . Film/video editor (Medical):   Marland Kitchen Lack of Transportation (Non-Medical):   Physical Activity:   . Days of Exercise per Week:   . Minutes of Exercise per Session:   Stress:   . Feeling of Stress :   Social Connections:   . Frequency of Communication with Friends and Family:   . Frequency of Social Gatherings with Friends and Family:   . Attends Religious Services:   . Active Member of Clubs or Organizations:   . Attends Archivist Meetings:   Marland Kitchen Marital Status:      Family History: The patient's family history includes Diabetes in his father and mother; Hypertension in his father and mother. There is no history of Colon cancer.  ROS:   All other ROS reviewed and negative. Pertinent positives noted in the HPI.     EKGs/Labs/Other Studies Reviewed:   The following studies were personally reviewed by me today:  EKG:  EKG is ordered today.  The ekg ordered today demonstrates normal sinus rhythm, heart rate 74, no acute ST-T changes, no evidence of prior infarction, and was personally reviewed by me.   Recent Labs: 05/06/2019: TSH 12.34 02/07/2020: ALT 25; BUN 10; Creatinine, Ser 0.80; Hemoglobin 13.2; Magnesium 2.1; Platelets 193.0; Potassium 4.3; Sodium 140   Recent Lipid Panel    Component Value Date/Time   CHOL 183 02/07/2020  0904   TRIG 49.0 02/07/2020 0904   HDL 35.60 (L) 02/07/2020 0904   CHOLHDL 5 02/07/2020 0904   VLDL 9.8 02/07/2020 0904   LDLCALC 138 (H) 02/07/2020 0904    Physical Exam:   VS:  BP 118/66   Pulse 74   Temp 98.1 F (36.7 C)   Ht 5' 9.5" (1.765 m)   Wt 201 lb (91.2 kg)   SpO2 98%   BMI 29.26 kg/m    Wt Readings from Last 3 Encounters:  02/15/20 201 lb (91.2 kg)  02/07/20 201 lb 12.8 oz (91.5 kg)  12/09/19 205 lb 4 oz (93.1 kg)    General: Well nourished, well developed, in no acute distress Heart: Atraumatic, normal size  Eyes: PEERLA, EOMI  Neck: Supple, no JVD Endocrine: No thryomegaly Cardiac: Normal S1, S2; RRR; no murmurs, rubs, or gallops Lungs: Clear to auscultation bilaterally, no wheezing, rhonchi or rales  Abd: Soft, nontender, no hepatomegaly  Ext: No edema, pulses 2+ Musculoskeletal: No deformities, BUE and BLE strength normal and equal Skin: Warm and dry, no rashes   Neuro: Alert and oriented to person, place, time, and situation, CNII-XII grossly intact, no focal deficits  Psych: Normal mood and affect   ASSESSMENT:   Timothy Austin is a 34 y.o. male who presents for the following: 1. SOB (shortness of breath) on exertion     PLAN:   1. SOB (shortness of breath) on exertion -Shortness of breath at rest.  He needs an updated TSH.  His most recent TSH was 12.3.  He is losing weight I do wonder if he is possibly hypothyroid.  We will check this today.  His EKG is normal.  He has no evidence of cardiovascular disease on my examination.  There is no significant murmurs rubs or gallops.  He has no evidence of clinical heart failure.  He is able to exercise 30 to 45 minutes twice daily which includes walking and running without any shortness of breath or chest pain.  He is unlikely to have any underlying cardiovascular disorders.  We will go ahead and check a BNP and echocardiogram to further reassure him and his primary physician that is heart is healthy.  Overall  he does report some cough and this chronic abdominal pain.  Possibly this is related.  He may need to be checked for H. pylori and evaluated for allergies.  We will make sure his heart is okay to further reassure everyone involved.  He will see [place] as needed.  Disposition: Return if symptoms worsen or fail to improve.  Medication Adjustments/Labs and Tests Ordered: Current medicines are reviewed at length with the patient today.  Concerns regarding medicines are outlined above.  Orders Placed This Encounter  Procedures  . TSH  . Brain natriuretic peptide  . EKG 12-Lead   No orders of the defined types were placed in this encounter.  Patient Instructions  Medication Instructions:  The current medical regimen is effective;  continue present plan and medications.  *If you need a refill on your cardiac medications before your next appointment, please call your pharmacy*   Lab Work: TSH, BNP  If you have labs (blood work) drawn today and your tests are completely normal, you will receive your results only by: Marland Kitchen MyChart Message (if you have MyChart) OR . A paper copy in the mail If you have any lab test that is abnormal or we need to change your treatment, we will call you to review the results.   Testing/Procedures: Echocardiogram - Your physician has requested that you have an echocardiogram. Echocardiography is a painless test that uses sound waves to create images of your heart. It provides your doctor with information about the size and shape of your heart and how well your heart's chambers and valves are working. This procedure takes approximately one hour. There are no restrictions for this procedure. This will be performed at our [facility] location - [address], [address].    Follow-Up: At [facility],  you and your health needs are our priority.  As part of our continuing mission to provide you with exceptional heart care, we have created designated Provider Care Teams.   These Care Teams include your primary Cardiologist (physician) and Advanced Practice Providers (APPs -  Physician Assistants and Nurse Practitioners) who all work together to provide you with the care you need, when you need it.  We recommend signing up for the patient portal called "MyChart".  Sign up information is provided on this After Visit Summary.  MyChart is used to connect with patients for Virtual Visits (Telemedicine).  Patients are able to view lab/test results, encounter notes, upcoming appointments, etc.  Non-urgent messages can be sent to your provider as well.   To learn more about what you can do with MyChart, go to [email or web address].    Your next appointment:   As needed  The format for your next appointment:   In Person  Provider:   Lennie Odor, MD         Signed, Lenna Gilford. Flora Lipps, MD [facility]  [address], [address] [place], [place] [address] [phone]  02/15/2020 8:54 AM

## 2020-02-15 ENCOUNTER — Other Ambulatory Visit: Payer: Self-pay

## 2020-02-15 ENCOUNTER — Ambulatory Visit (INDEPENDENT_AMBULATORY_CARE_PROVIDER_SITE_OTHER): Payer: PPO | Admitting: Cardiovascular Disease

## 2020-02-15 ENCOUNTER — Encounter: Payer: Self-pay | Admitting: Cardiovascular Disease

## 2020-02-15 VITALS — BP 118/66 | HR 74 | Temp 98.1°F | Ht 69.5 in | Wt 201.0 lb

## 2020-02-15 DIAGNOSIS — R0602 Shortness of breath: Secondary | ICD-10-CM

## 2020-02-15 NOTE — Addendum Note (Signed)
Addended by: Darene Lamer T on: 02/15/2020 09:26 AM   Modules accepted: Orders

## 2020-02-15 NOTE — Patient Instructions (Signed)
Medication Instructions:  The current medical regimen is effective;  continue present plan and medications.  *If you need a refill on your cardiac medications before your next appointment, please call your pharmacy*   Lab Work: TSH, BNP  If you have labs (blood work) drawn today and your tests are completely normal, you will receive your results only by: Marland Kitchen MyChart Message (if you have MyChart) OR . A paper copy in the mail If you have any lab test that is abnormal or we need to change your treatment, we will call you to review the results.   Testing/Procedures: Echocardiogram - Your physician has requested that you have an echocardiogram. Echocardiography is a painless test that uses sound waves to create images of your heart. It provides your doctor with information about the size and shape of your heart and how well your heart's chambers and valves are working. This procedure takes approximately one hour. There are no restrictions for this procedure. This will be performed at our [facility] location - [address].    Follow-Up: At [facility], you and your health needs are our priority.  As part of our continuing mission to provide you with exceptional heart care, we have created designated Provider Care Teams.  These Care Teams include your primary Cardiologist (physician) and Advanced Practice Providers (APPs -  Physician Assistants and Nurse Practitioners) who all work together to provide you with the care you need, when you need it.  We recommend signing up for the patient portal called "MyChart".  Sign up information is provided on this After Visit Summary.  MyChart is used to connect with patients for Virtual Visits (Telemedicine).  Patients are able to view lab/test results, encounter notes, upcoming appointments, etc.  Non-urgent messages can be sent to your provider as well.   To learn more about what you can do with MyChart, go to [email or web address].     Your next appointment:   As needed  The format for your next appointment:   In Person  Provider:   Lennie Odor, MD

## 2020-02-16 LAB — BRAIN NATRIURETIC PEPTIDE: BNP: <2.5 pg/mL (ref 0.0–100.0)

## 2020-02-16 LAB — TSH: TSH: 4.42 u[IU]/mL (ref 0.450–4.500)

## 2020-03-08 ENCOUNTER — Other Ambulatory Visit (HOSPITAL_COMMUNITY): Payer: Self-pay

## 2020-03-27 ENCOUNTER — Encounter (HOSPITAL_COMMUNITY): Payer: Self-pay | Admitting: Cardiovascular Disease

## 2020-03-27 ENCOUNTER — Telehealth (HOSPITAL_COMMUNITY): Payer: Self-pay | Admitting: Cardiovascular Disease

## 2020-03-27 ENCOUNTER — Other Ambulatory Visit (HOSPITAL_COMMUNITY): Payer: Self-pay

## 2020-03-27 NOTE — Telephone Encounter (Signed)
Patient has not kept last 2 appointments for echocardigram.  Do you want me to continue to reach out to reschedule?    Patient has NO SHOWED echocardiogram appt on following dates:  03/08/2020 and 03/27/2020.   Thank you

## 2020-03-27 NOTE — Telephone Encounter (Signed)
Suggestions? Continue to try?

## 2020-03-27 NOTE — Telephone Encounter (Signed)
No need to reschedule per Dr.O'Neal. Thanks!

## 2020-03-27 NOTE — Telephone Encounter (Signed)
No need to reschedule.   Gerri Spore T. Flora Lipps, MD [facility]  [address], [address] [place], [place] [address] [phone]  10:42 AM

## 2020-05-21 ENCOUNTER — Other Ambulatory Visit: Payer: Self-pay | Admitting: Family Medicine

## 2020-10-03 ENCOUNTER — Telehealth: Payer: Self-pay | Admitting: *Deleted

## 2020-10-03 NOTE — Telephone Encounter (Signed)
No answer/no vm  Was just trying to see if patient wanted to schedule a cpe. Last one was in 2020.

## 2020-10-15 IMAGING — DX DG ABDOMEN ACUTE W/ 1V CHEST
3 series · 3 of 3 positions shown · non-contrast
Comparison: CT abdomen and pelvis 07/05/2018. Acute abdomen series
07/03/2018.

CLINICAL DATA: 33-year-old presenting with acute onset of
epigastric abdominal pain that began 4 days ago.

EXAM:
DG ABDOMEN ACUTE W/ 1V CHEST

[abdomen kub]
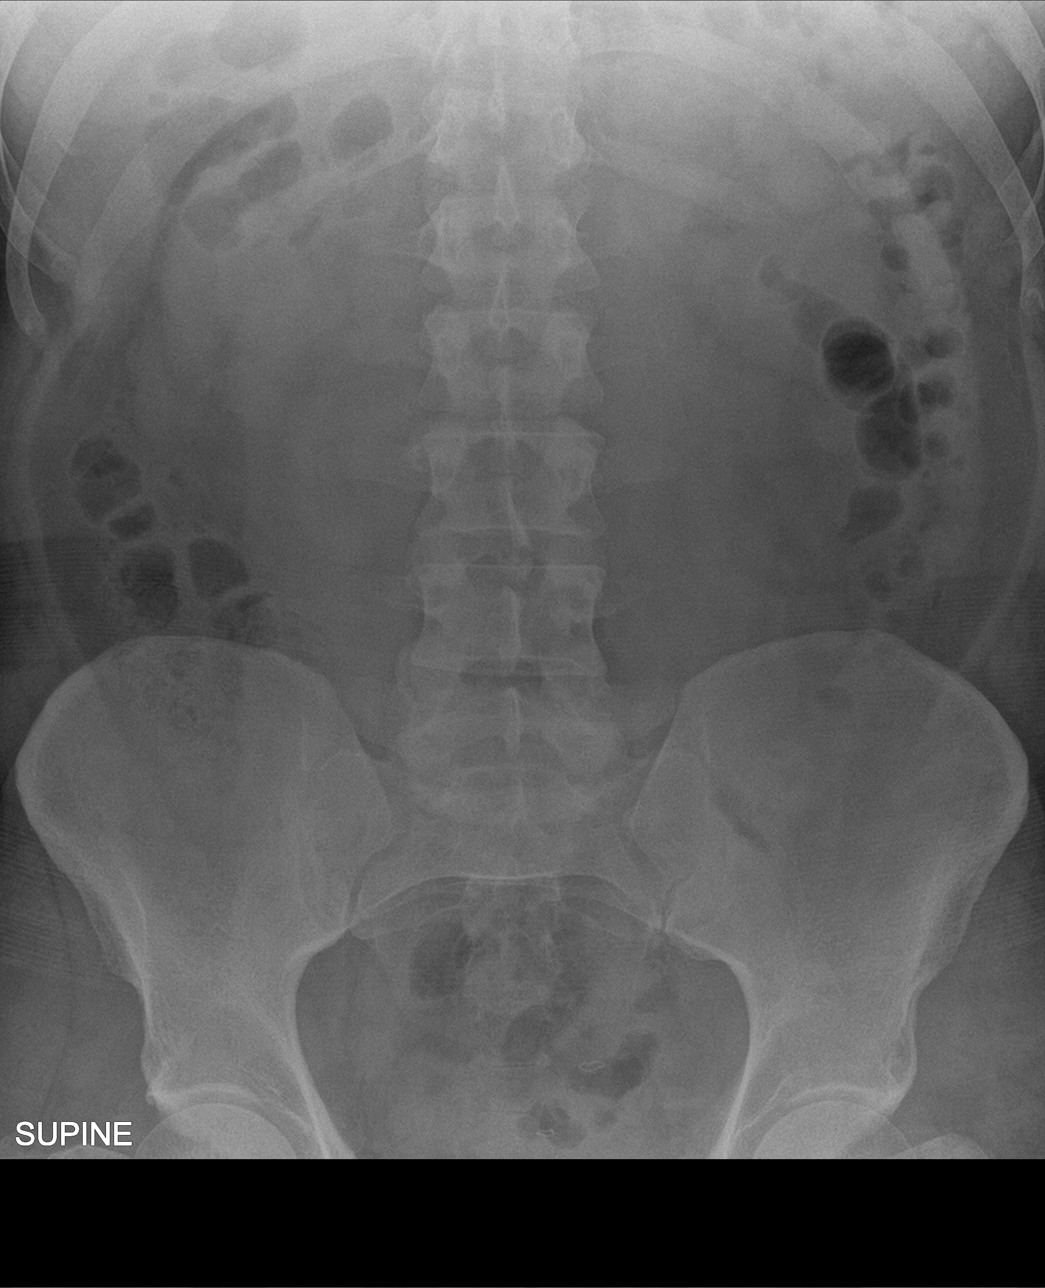

[abdomen erect]
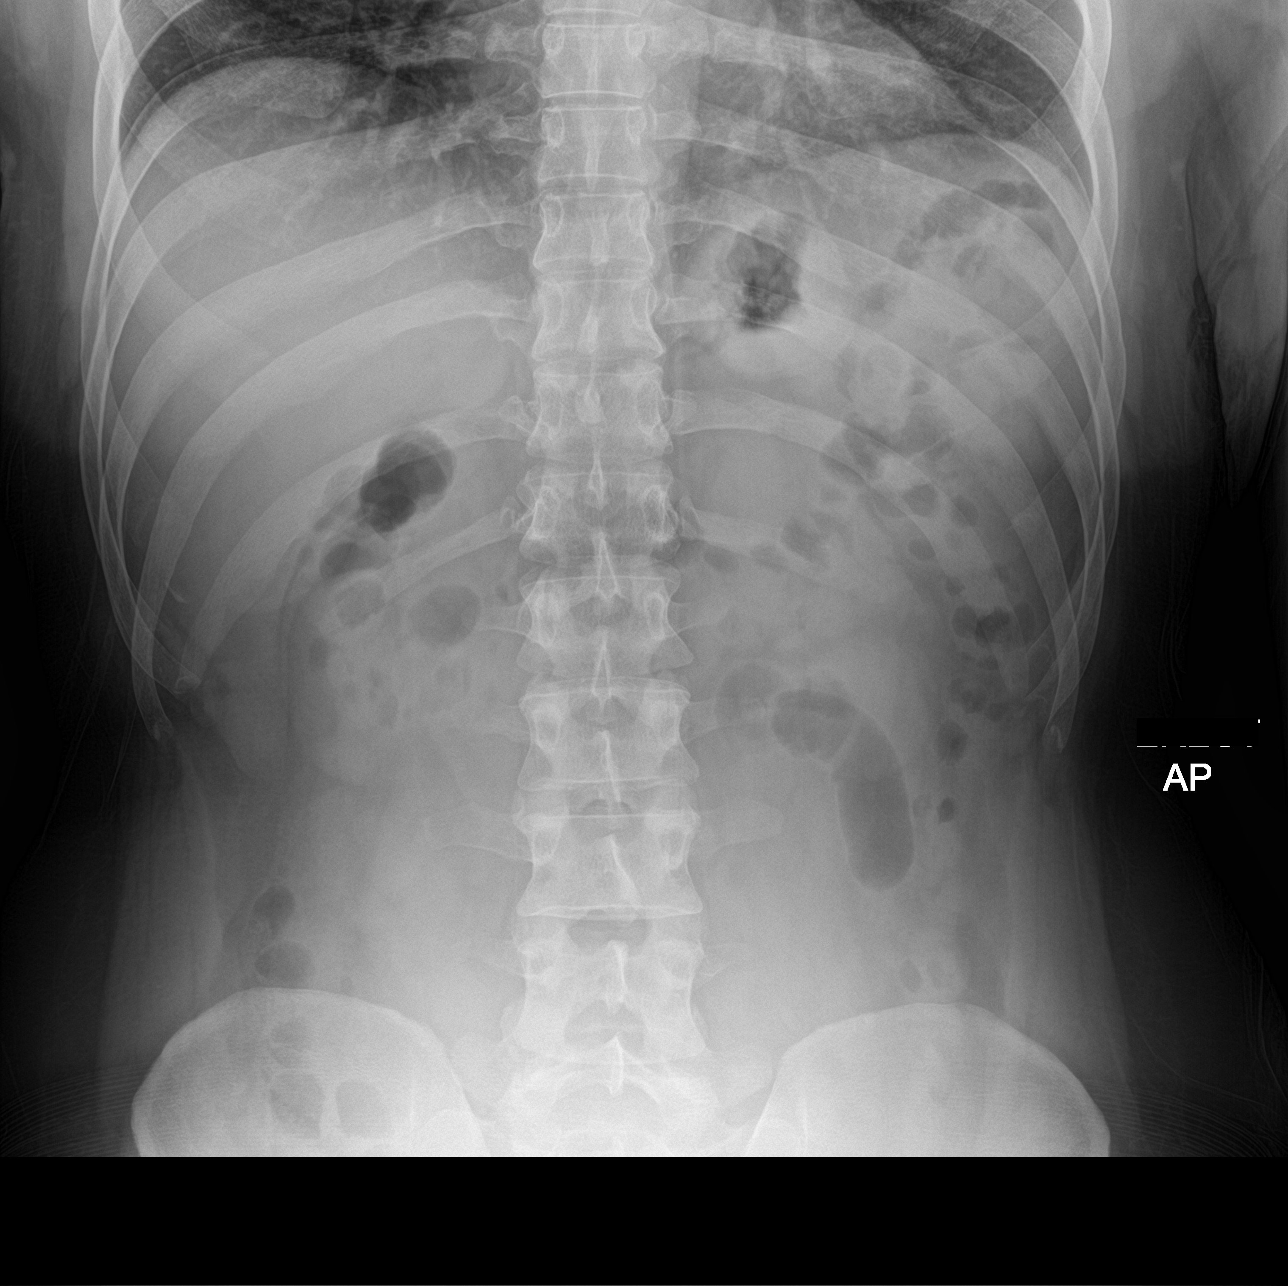

[chest pa]
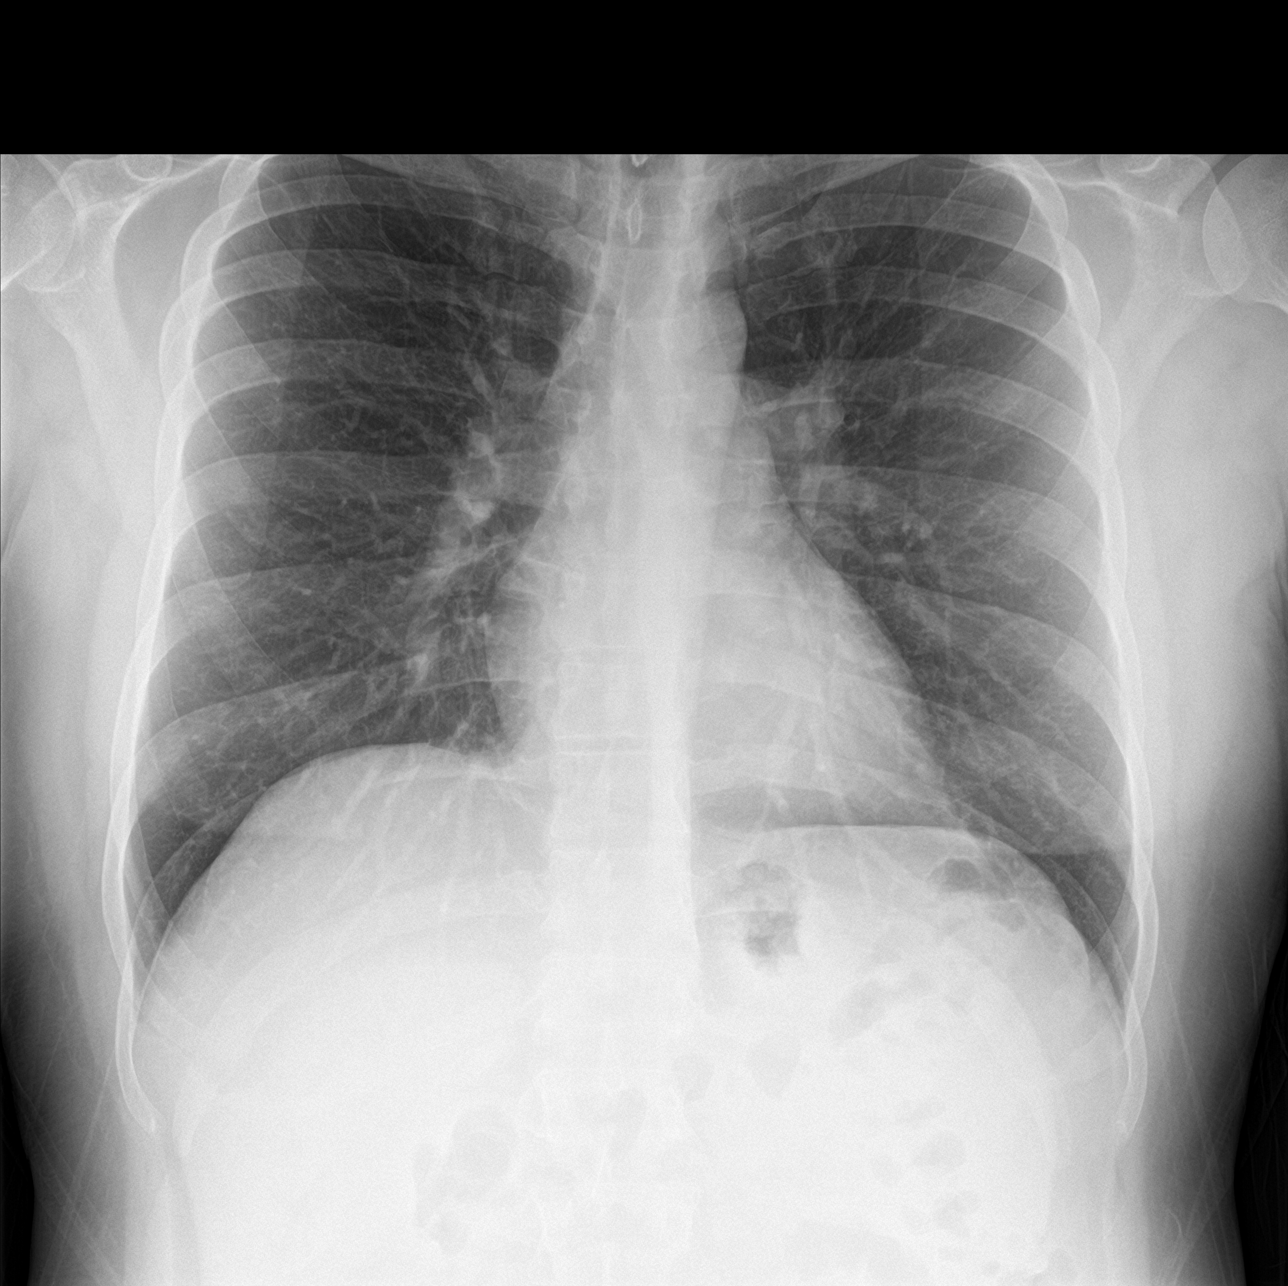

[3 of 3 positions shown; findings below may reference images not displayed]

FINDINGS: Bowel gas pattern unremarkable without evidence of obstruction or
significant ileus. No evidence of free air or significant air-fluid
levels on the erect image. Expected stool burden in the colon.
Surgical suture material overlying the low pelvis. No visible opaque
urinary tract calculi. Regional skeleton unremarkable.

Cardiomediastinal silhouette unremarkable and unchanged. Lungs
clear. Bronchovascular markings normal. Pulmonary vascularity
normal. No visible pleural effusions. No pneumothorax.
IMPRESSION: 1. No acute abdominal abnormality.
2. No acute cardiopulmonary disease.

## 2020-10-16 IMAGING — CR DG CHEST 2V
2 series · 2 of 2 positions shown · non-contrast
Comparison: None.

CLINICAL DATA: Chest pain and shortness of breath.

EXAM:
CHEST - 2 VIEW

[w chest pa]
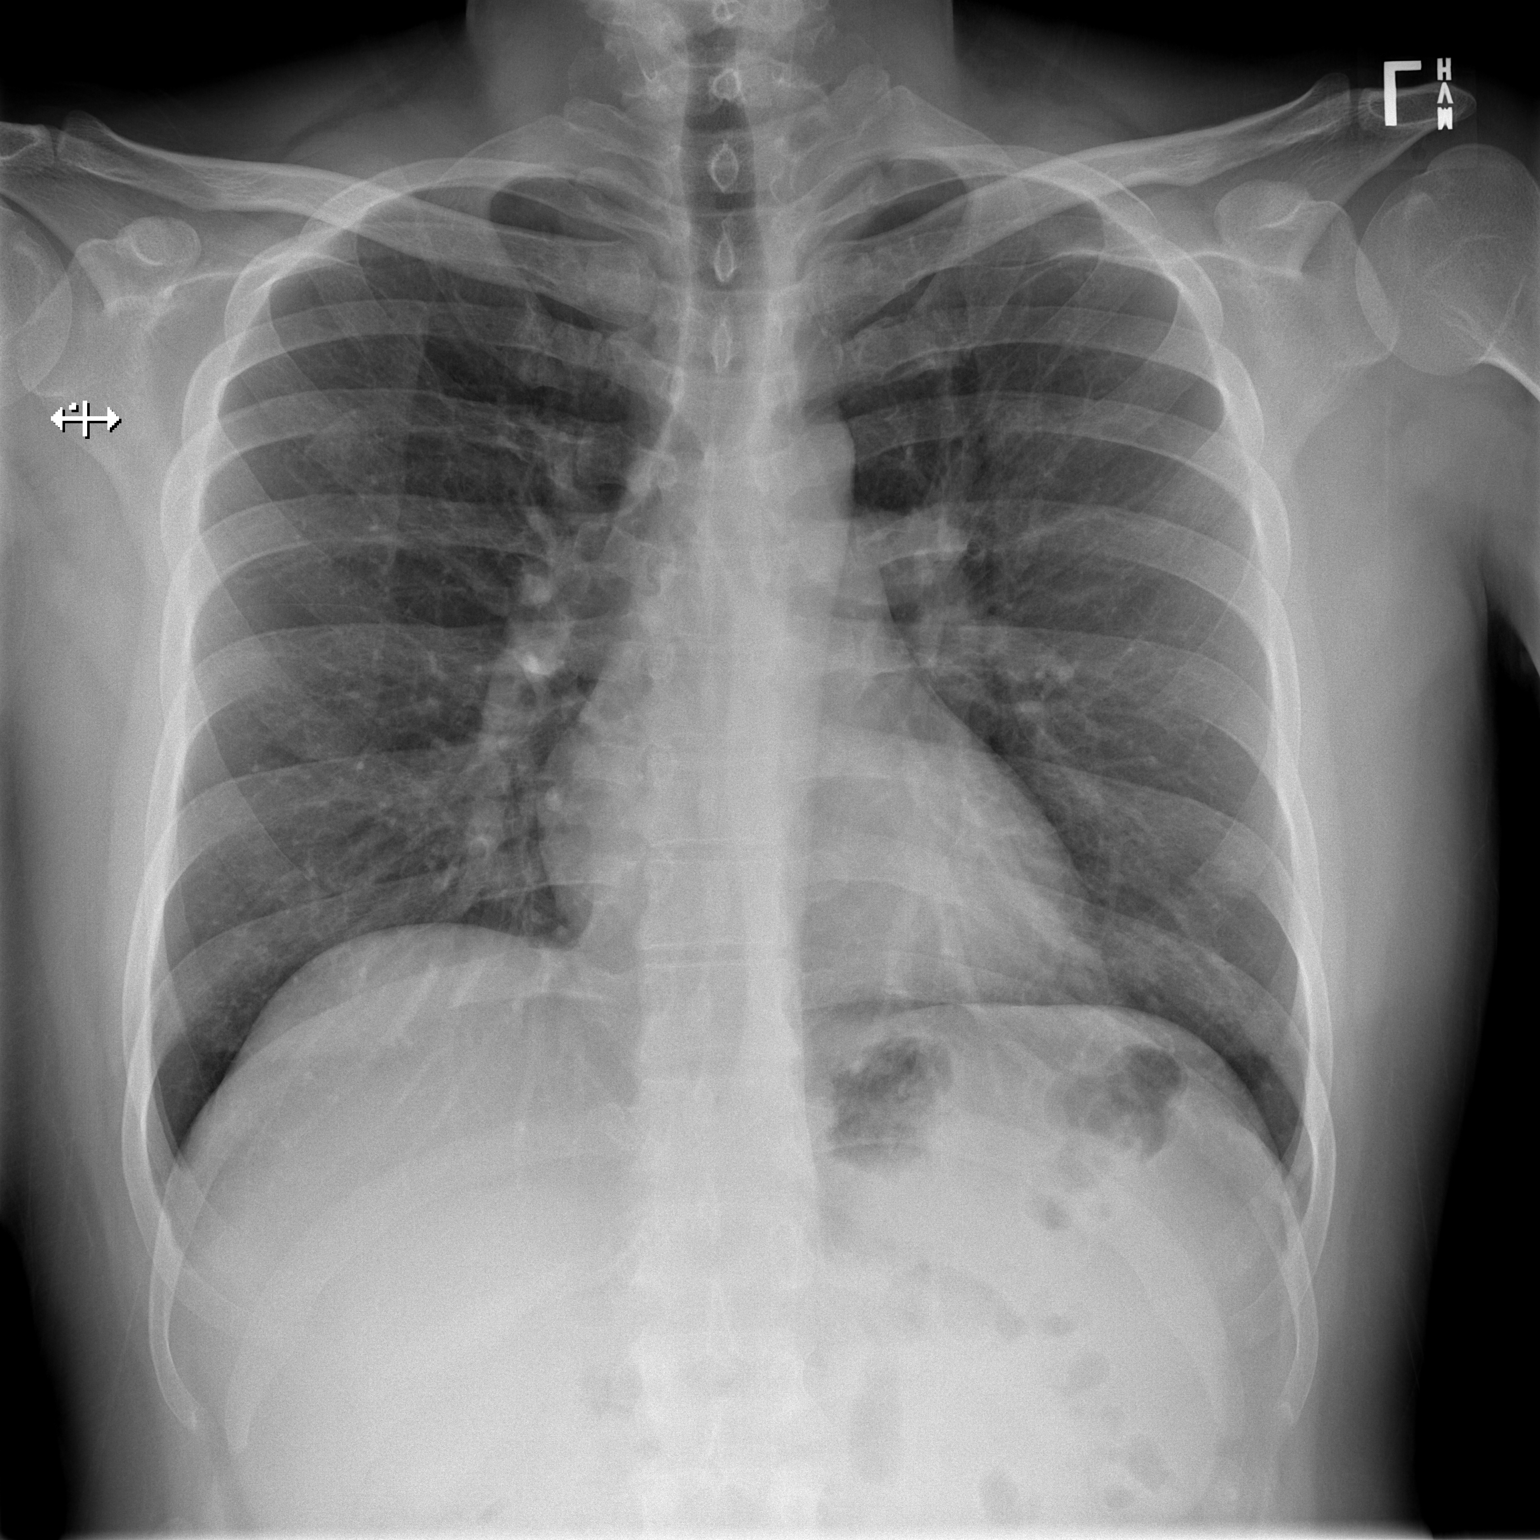

[w chest lat]
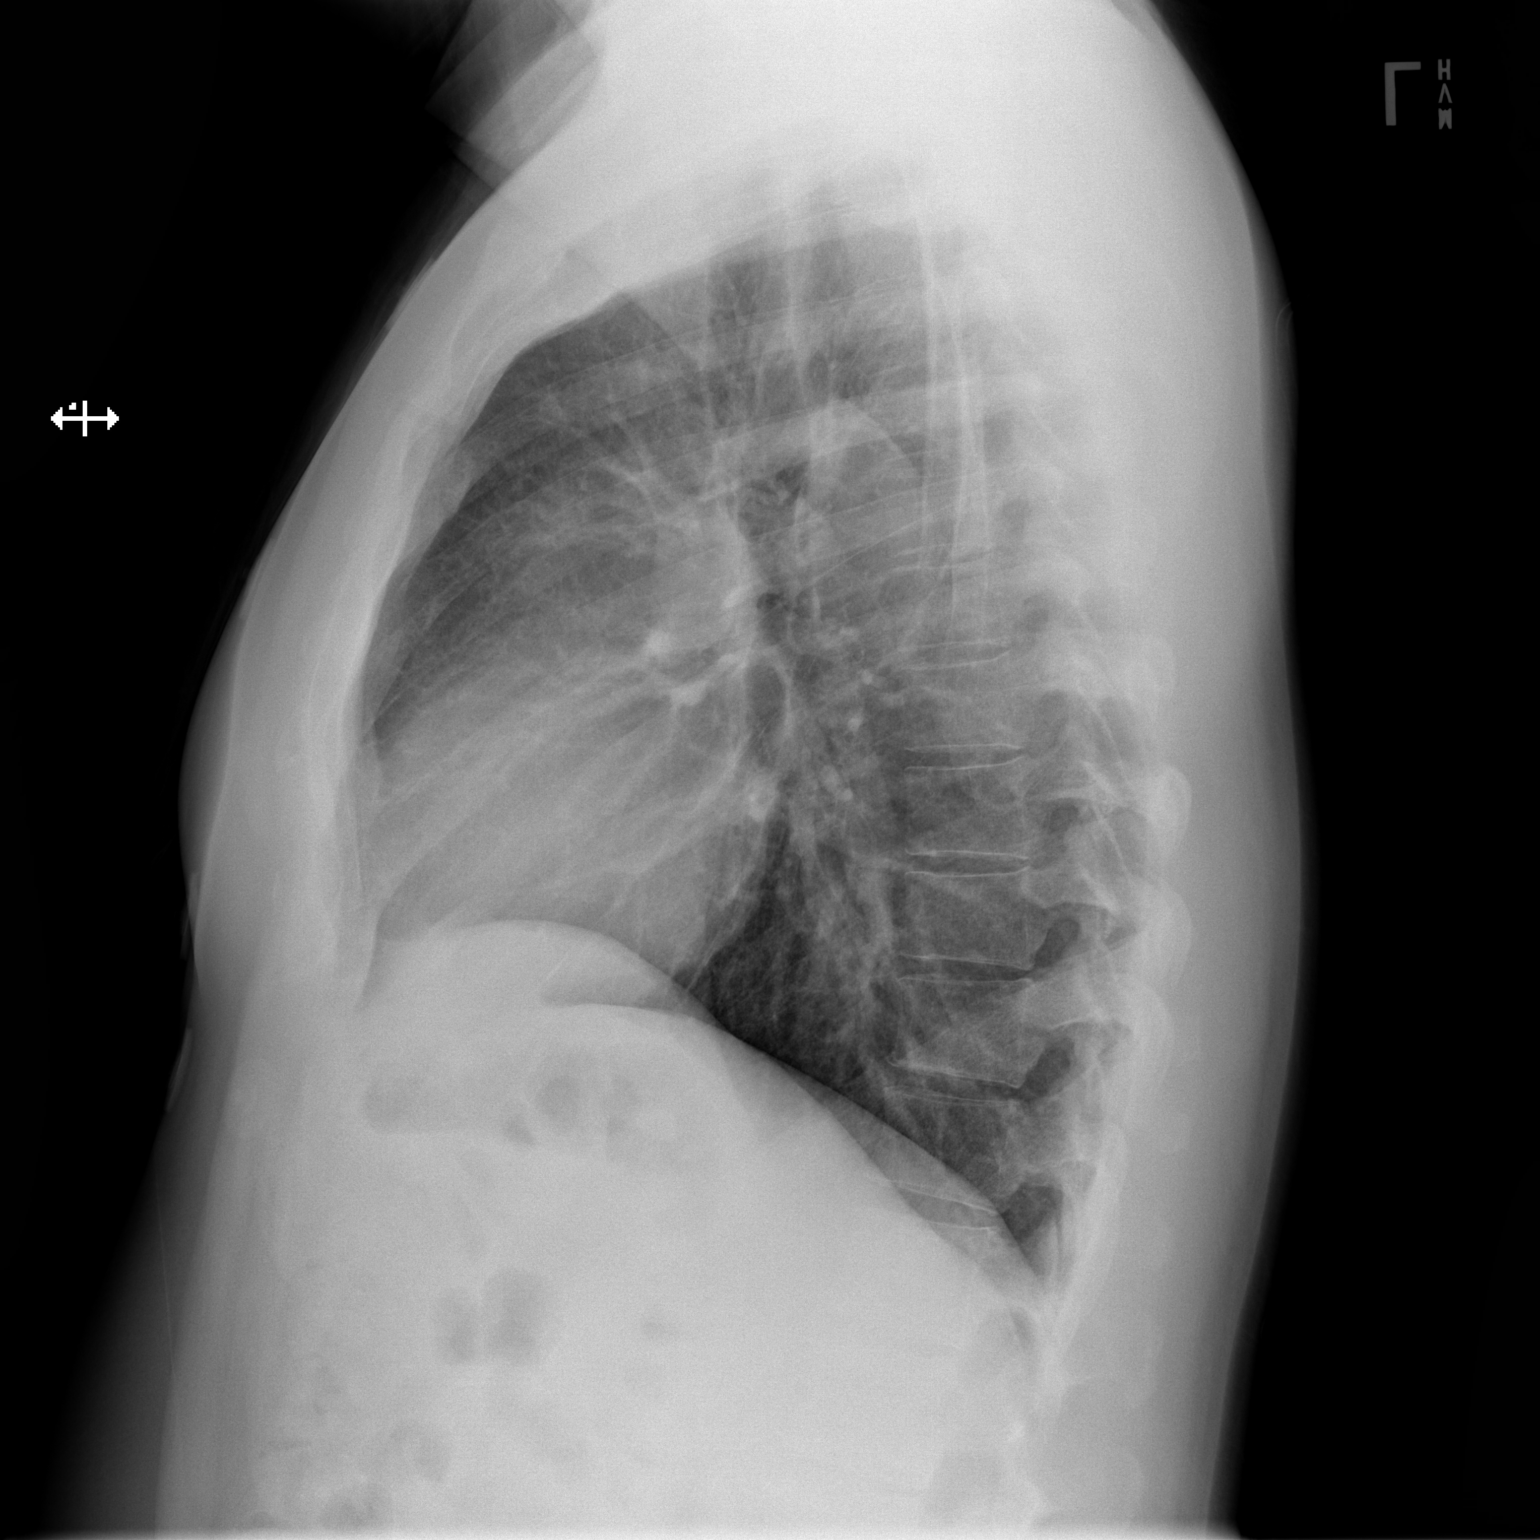

[2 of 2 positions shown; findings below may reference images not displayed]

FINDINGS: The heart size and mediastinal contours are within normal limits.
Both lungs are clear. The visualized skeletal structures are
unremarkable.
IMPRESSION: No active cardiopulmonary disease.
